# Patient Record
Sex: Female | Born: 1945
Health system: Southern US, Community
[De-identification: ages and names within clinical notes are randomized; demographics above are authoritative.]

## PROBLEM LIST (undated history)

## (undated) DIAGNOSIS — Z800 Family history of malignant neoplasm of digestive organs: Secondary | ICD-10-CM

## (undated) DIAGNOSIS — Z955 Presence of coronary angioplasty implant and graft: Secondary | ICD-10-CM

## (undated) DIAGNOSIS — I2510 Atherosclerotic heart disease of native coronary artery without angina pectoris: Secondary | ICD-10-CM

## (undated) DIAGNOSIS — I10 Essential (primary) hypertension: Secondary | ICD-10-CM

## (undated) DIAGNOSIS — M47812 Spondylosis without myelopathy or radiculopathy, cervical region: Secondary | ICD-10-CM

## (undated) HISTORY — DX: Family history of malignant neoplasm of digestive organs: Z80.0

## (undated) HISTORY — DX: Spondylosis without myelopathy or radiculopathy, cervical region: M47.812

---

## 1999-04-13 ENCOUNTER — Other Ambulatory Visit: Admission: RE | Admit: 1999-04-13 | Discharge: 1999-04-13 | Payer: Self-pay | Admitting: *Deleted

## 1999-11-11 ENCOUNTER — Ambulatory Visit (HOSPITAL_COMMUNITY): Admission: RE | Admit: 1999-11-11 | Discharge: 1999-11-11 | Payer: Self-pay | Admitting: Gastroenterology

## 2000-04-20 ENCOUNTER — Other Ambulatory Visit: Admission: RE | Admit: 2000-04-20 | Discharge: 2000-04-20 | Payer: Self-pay | Admitting: *Deleted

## 2000-04-21 ENCOUNTER — Encounter (INDEPENDENT_AMBULATORY_CARE_PROVIDER_SITE_OTHER): Payer: Self-pay

## 2000-04-21 ENCOUNTER — Other Ambulatory Visit: Admission: RE | Admit: 2000-04-21 | Discharge: 2000-04-21 | Payer: Self-pay | Admitting: *Deleted

## 2001-04-23 ENCOUNTER — Other Ambulatory Visit: Admission: RE | Admit: 2001-04-23 | Discharge: 2001-04-23 | Payer: Self-pay | Admitting: *Deleted

## 2002-04-25 ENCOUNTER — Other Ambulatory Visit: Admission: RE | Admit: 2002-04-25 | Discharge: 2002-04-25 | Payer: Self-pay | Admitting: Obstetrics and Gynecology

## 2003-05-01 ENCOUNTER — Other Ambulatory Visit: Admission: RE | Admit: 2003-05-01 | Discharge: 2003-05-01 | Payer: Self-pay | Admitting: Obstetrics and Gynecology

## 2003-08-26 ENCOUNTER — Ambulatory Visit (HOSPITAL_COMMUNITY): Admission: RE | Admit: 2003-08-26 | Discharge: 2003-08-26 | Payer: Self-pay | Admitting: Obstetrics and Gynecology

## 2003-08-26 ENCOUNTER — Encounter (INDEPENDENT_AMBULATORY_CARE_PROVIDER_SITE_OTHER): Payer: Self-pay | Admitting: *Deleted

## 2005-05-24 ENCOUNTER — Ambulatory Visit (HOSPITAL_COMMUNITY): Admission: RE | Admit: 2005-05-24 | Discharge: 2005-05-24 | Payer: Self-pay | Admitting: Gastroenterology

## 2006-05-19 ENCOUNTER — Encounter: Admission: RE | Admit: 2006-05-19 | Discharge: 2006-05-19 | Payer: Self-pay | Admitting: Internal Medicine

## 2010-07-23 ENCOUNTER — Encounter: Admission: RE | Admit: 2010-07-23 | Discharge: 2010-07-23 | Payer: Self-pay | Admitting: Neurosurgery

## 2010-09-16 ENCOUNTER — Ambulatory Visit (HOSPITAL_COMMUNITY): Admission: RE | Admit: 2010-09-16 | Discharge: 2010-09-18 | Payer: Self-pay | Admitting: Neurosurgery

## 2011-01-18 LAB — CBC
Hemoglobin: 12.9 g/dL (ref 12.0–15.0)
MCHC: 35 g/dL (ref 30.0–36.0)
RBC: 4.17 MIL/uL (ref 3.87–5.11)

## 2011-01-18 LAB — TYPE AND SCREEN: ABO/RH(D): O POS

## 2011-01-18 LAB — BASIC METABOLIC PANEL
Calcium: 9.1 mg/dL (ref 8.4–10.5)
GFR calc Af Amer: >60 mL/min (ref >60)
GFR calc non Af Amer: >60 mL/min (ref >60)
Glucose, Bld: 83 mg/dL (ref 70–99)
Sodium: 134 mEq/L — ABNORMAL LOW (ref 135–145)

## 2011-01-18 LAB — ABO/RH: ABO/RH(D): O POS

## 2011-01-18 LAB — SURGICAL PCR SCREEN: Staphylococcus aureus: NEGATIVE

## 2011-03-25 NOTE — H&P (Signed)
NAME:  [name], [name]                           ACCOUNT NO.:  [id number]   MEDICAL RECORD NO.:  [id number]                   PATIENT TYPE:  AMB   LOCATION:  SDC                                  FACILITY:  [facility]   PHYSICIAN:  Maxie Better, M.D.            DATE OF BIRTH:  February 28, 1946   DATE OF ADMISSION:  08/26/2003  DATE OF DISCHARGE:                                HISTORY & PHYSICAL   CHIEF COMPLAINT:  Postmenopausal bleeding, endometrial mass.   HISTORY OF PRESENT ILLNESS:  This is a 65 year old gravida 3, para 3,  postmenopausal married white female now being admitted for a D&C and  hysteroscopic removal of an endometrial mass found after evaluation for  postmenopausal bleeding.  The patient underwent ultrasound with subsequent  finding of a hyperechoic mass in the posterior wall on sonohysterogram.  The  patient now presents for surgical evaluation.   PAST MEDICAL HISTORY:  No known drug allergies.   Medical history is chronic hypertension.   Medicines are:  1. Lisinopril.  2. Prempro.  3. Multivitamins.   SURGICAL HISTORY:  Cauterization of the cervix in 1979.   OBSTETRICAL HISTORY:  Vaginal delivery x3.   FAMILY HISTORY:  Mother had thyroidectomy, diabetes, colon cancer.  No  breast or ovarian cancer.   REVIEW OF SYSTEMS:  Negative except as in history of present illness.   PHYSICAL EXAMINATION:  GENERAL:  A well-developed, well-nourished white  female in no acute distress.  VITAL SIGNS:  Blood pressure 122/72, temperature of 98.7, weight is 154-1/2.  SKIN:  No lesions.  HEENT:  Anicteric sclerae, pink sclerae, oropharynx negative.  CARDIAC:  Regular rate and rhythm without murmur.  CHEST:  Lungs are clear to auscultation.  BREASTS:  Soft, nontender, no palpable mass, upward nipples.  NECK:  Supple, no thyroid palpable.  ABDOMEN:  Soft, nontender, no organomegaly.  EXTREMITIES:  No edema.  PELVIC:  Vulva with no lesions.  The vagina had no discharge.  Cervix  was  parous.  Uterus anteverted, normal size, nontender.  Adnexa nontender, no  palpable masses.   IMPRESSION:  1. Postmenopausal bleeding.  2. Endometrial mass.   PLAN:  Admission for D&C, hysteroscopy, removal of endometrial mass.  Risks  of the procedure including but not limited to infection, bleeding, uterine  perforation and its management, fluid overload and its management, inability  to complete the procedure in one sitting, injury to surrounding organ  structures such as the bladder, bowel, or ureter, thermal injury, routine  labs ordered, all questions answered.                                                Maxie Better, M.D.    [place]/MEDQ  D:  08/26/2003  T:  08/26/2003  Job:  [id number]

## 2011-03-25 NOTE — Op Note (Signed)
NAME:  [name], [name] NO.:  [id number]   MEDICAL RECORD NO.:  [id number]          PATIENT TYPE:  AMB   LOCATION:  ENDO                         FACILITY:  [facility]   PHYSICIAN:  [name], M.D.   DATE OF BIRTH:  [birth year]   DATE OF PROCEDURE:  05/24/2005  DATE OF DISCHARGE:                                 OPERATIVE REPORT   PROCEDURE:  Surveillance colonoscopy.   INDICATIONS:  [name] is a 65 year old female born October 06, 1946. [name] mother was diagnosed with colon cancer. [name]  underwent her first screening colonoscopy approximately five years ago which  was normal. A surveillance colonoscopy with polypectomy to prevent colon  cancer is scheduled today.   ENDOSCOPIST:  [name], M.D.   PREMEDICATION:  Versed 10 mg, Demerol 50 mg.   DESCRIPTION OF PROCEDURE:  After obtaining informed consent, [name] was  placed in the left lateral decubitus position. I administered intravenous  Demerol and intravenous Versed to achieve conscious sedation for the  procedure. The patient's blood pressure, oxygen saturation and cardiac  rhythm were monitored throughout the procedure and documented in the medical  record.   Anal inspection and digital rectal exam were normal. The Olympus adjustable  pediatric colonoscope was introduced into the rectum and easily advanced to  the cecum with application of external abdominal pressure with the patient  remaining in the left lateral decubitus position. Colonic preparation for  the exam today was excellent   RECTUM:  Normal.  SIGMOID COLON AND DESCENDING COLON:  Normal.  SPLENIC FLEXURE:  Normal.  TRANSVERSE COLON:  Normal.  HEPATIC FLEXURE:  Normal.  ASCENDING COLON:  Normal. Diverticula are noted.  CECUM AND ILEOCECAL VALVE:  Normal.   ASSESSMENT:  Normal proctocolonoscopy to the cecum. No endoscopic evidence  for the presence of colorectal neoplasia. Diverticulosis of the ascending  colon  noted.   RECOMMENDATIONS:  Repeat colonoscopy in five years.       [facility]/MEDQ  D:  05/24/2005  T:  05/24/2005  Job:  [id number]   cc:   Candyce Churn, M.D.  [address] [place]  [place] [address]  Fax: [phone]

## 2011-03-25 NOTE — Op Note (Signed)
NAME:  [name], [name]                           ACCOUNT NO.:  [id number]   MEDICAL RECORD NO.:  [id number]                   PATIENT TYPE:  AMB   LOCATION:  SDC                                  FACILITY:  [facility]   PHYSICIAN:  Maxie Better, M.D.            DATE OF BIRTH:  06/16/1946   DATE OF PROCEDURE:  08/26/2003  DATE OF DISCHARGE:                                 OPERATIVE REPORT   PREOPERATIVE DIAGNOSES:  1. Postmenopausal bleeding.  2. Endometrial mass.   POSTOPERATIVE DIAGNOSES:  1. Postmenopausal bleeding.  2. Endometrial polyp.   PROCEDURE:  Diagnostic hysteroscopy, resection of endometrial polyp,  dilatation and curettage.   ANESTHESIA:  General anesthesia.   SURGEON:  Maxie Better, M.D.   INDICATIONS FOR PROCEDURE:  A 65 year old gravida 3, para 3, postmenopausal  married white female on hormone replacement therapy who was found on  sonohistogram to have an endometrial mass after evaluation for  postmenopausal bleeding and now presents for surgical management.  Risks and  benefits of the planned procedure had been explained to the patient.  Consent was signed.  The patient was transferred to the operating room.   DESCRIPTION OF PROCEDURE:  Under adequate general anesthesia, the patient  was placed in the dorsal lithotomy position.  She was sterilely prepped and  draped in the usual fashion.  In and out catheter revealed quantity  sufficient of urine.  Examination under anesthesia revealed an axial small  uterus, no adnexal masses could be appreciated.  A bivalve speculum was  placed in the vagina.  Cervix was noted to be parous.  A single-tooth  tenaculum was placed in the anterior lip of the cervix.  The cervix was  serially dilated up to #25 [facility] dilator.  A diagnostic hysteroscope was  introduced into the uterine cavity without incident.  A polypoid mass was  noted extending up the frontal area.  The diagnostic hysteroscope was  removed.  The cervix  was then further dilated up to #31 [facility] dilator and  the resectoscope was introduced.  The double loop was then used to resect  the endometrial polyp.  Both tubal ostia were seen.  The endocervical canal  was inspected.  No polyps noted.  The resectoscope was then removed.  The  cavity was then curetted for scant amount of tissue.  All instruments were  then removed from the vagina.  Specimen labeled endometrial polyp and  endometrial curetting was sent to pathology. Estimated blood loss was  minimal.  Fluid deficit was 80 mL.   COMPLICATIONS:  None.   The patient tolerated the procedure well and was transferred to the recovery  room in stable condition.  Maxie Better, M.D.   [place]/MEDQ  D:  08/26/2003  T:  08/26/2003  Job:  [id number]

## 2011-05-30 ENCOUNTER — Other Ambulatory Visit: Payer: Self-pay | Admitting: Gastroenterology

## 2011-06-06 ENCOUNTER — Encounter (HOSPITAL_COMMUNITY): Payer: Self-pay

## 2011-06-06 ENCOUNTER — Emergency Department (HOSPITAL_COMMUNITY): Payer: 59

## 2011-06-06 ENCOUNTER — Observation Stay (HOSPITAL_COMMUNITY)
Admission: EM | Admit: 2011-06-06 | Discharge: 2011-06-06 | Disposition: A | Discharge status: Home / Self Care | Payer: 59 | Attending: Emergency Medicine | Admitting: Emergency Medicine

## 2011-06-06 DIAGNOSIS — R61 Generalized hyperhidrosis: Secondary | ICD-10-CM | POA: Insufficient documentation

## 2011-06-06 DIAGNOSIS — R072 Precordial pain: Secondary | ICD-10-CM

## 2011-06-06 DIAGNOSIS — I10 Essential (primary) hypertension: Secondary | ICD-10-CM | POA: Insufficient documentation

## 2011-06-06 DIAGNOSIS — R079 Chest pain, unspecified: Principal | ICD-10-CM | POA: Insufficient documentation

## 2011-06-06 HISTORY — DX: Essential (primary) hypertension: I10

## 2011-06-06 LAB — DIFFERENTIAL
Basophils Absolute: 0 10*3/uL (ref 0.0–0.1)
Eosinophils Relative: 2 % (ref 0–5)
Lymphocytes Relative: 21 % (ref 12–46)
Lymphs Abs: 2.1 10*3/uL (ref 0.7–4.0)
Monocytes Absolute: 0.6 10*3/uL (ref 0.1–1.0)

## 2011-06-06 LAB — CK TOTAL AND CKMB (NOT AT ARMC)
CK, MB: 1.8 ng/mL (ref 0.3–4.0)
Relative Index: INVALID (ref 0.0–2.5)
Relative Index: INVALID (ref 0.0–2.5)

## 2011-06-06 LAB — TROPONIN I: Troponin I: <0.3 ng/mL (ref <0.30)

## 2011-06-06 LAB — CBC
HCT: 38.2 % (ref 36.0–46.0)
MCH: 30.5 pg (ref 26.0–34.0)
MCHC: 35.1 g/dL (ref 30.0–36.0)
MCV: 86.8 fL (ref 78.0–100.0)
RDW: 12.4 % (ref 11.5–15.5)

## 2011-06-06 LAB — URINALYSIS, ROUTINE W REFLEX MICROSCOPIC
Glucose, UA: NEGATIVE mg/dL
Ketones, ur: NEGATIVE mg/dL
Protein, ur: NEGATIVE mg/dL

## 2011-06-06 LAB — BASIC METABOLIC PANEL
BUN: 31 mg/dL — ABNORMAL HIGH (ref 6–23)
Calcium: 9.8 mg/dL (ref 8.4–10.5)
Creatinine, Ser: 0.8 mg/dL (ref 0.50–1.10)
GFR calc Af Amer: >60 mL/min (ref >60)
GFR calc non Af Amer: >60 mL/min (ref >60)

## 2011-06-06 MED ORDER — IOHEXOL 300 MG/ML  SOLN
80.0000 mL | Freq: Once | INTRAMUSCULAR | Status: AC | PRN
  Administered 2011-06-06: 110 mL via INTRAVENOUS

## 2011-06-08 HISTORY — PX: CARDIAC CATHETERIZATION: SHX172

## 2011-06-08 NOTE — Consult Note (Signed)
[name], [name] NO.:  [id number]  MEDICAL RECORD NO.:  [id number]  LOCATION:  1899                         FACILITY:  [facility]  PHYSICIAN:  Corky Crafts, MDDATE OF BIRTH:  Mar 30, 1946  DATE OF CONSULTATION:  06/06/2011 DATE OF DISCHARGE:  06/06/2011                                CONSULTATION   PRIMARY CARE PHYSICIAN:  Pearla Dubonnet, MD  REASON FOR CONSULTATION:  Chest discomfort.  HISTORY OF PRESENT ILLNESS:  The patient is a 65 year old woman with hypertension who felt chest pressure last night when going to bed.  She took an aspirin.  She just describes feeling different.  She does not usually exercise due to a discomfort that she has in her upper chest. She also has some foot pain.  The upper chest discomfort has been going on for over a year.  She had some type of neck abnormality requiring surgery.  She was hoping that the upper chest discomfort would resolve with this, but it has persisted and remained essentially unchanged.  She had no associated nausea, vomiting, or diaphoresis.  She did feel some chills.  There was no radiation of the pain.  She walked on the treadmill for 6 minutes doing standard Bruce protocol after ruling out for MI in the emergency room.  She had some ECG changes at peak exercise.  She also had 7/10 upper chest pain.  Echo images showed an inferoapical wall motion abnormality with exercise.  Her pain resolved with rest.  PAST MEDICAL HISTORY:  Hypertension.  PAST SURGICAL HISTORY:  Neck surgery in November 2011.  ALLERGIES:  No known drug allergies.  SOCIAL HISTORY:  She does not smoke.  FAMILY HISTORY:  Both parents had coronary artery disease in their 41s.  MEDICATIONS AT HOME: 1. Aspirin 81 mg daily. 2. Estradiol. 3. Lisinopril 25/12.5 mg daily.  REVIEW OF SYSTEMS:  Significant for the chest discomfort.  She also has foot pain.  No bleeding problems.  No kidney problems.  All other systems are  negative.  PHYSICAL EXAMINATION:  VITAL SIGNS:  Blood pressure 125/67 and heart rate 86. GENERAL:  She is awake and alert, no apparent distress. HEAD:  Normocephalic and atraumatic. EYES:  Extraocular movements are intact. NECK:  No JVD. CARDIOVASCULAR:  Regular rate and rhythm.  S1 and S2. LUNGS:  Clear to auscultation bilaterally. ABDOMEN:  Soft and nontender. EXTREMITIES:  No edema. NEURO:  No focal motor or sensory deficits.  LAB WORK:  Troponin is negative.  Creatinine 0.8, potassium 4.9.  EKG showed normal sinus rhythm with nonspecific ST-T wave changes.  Stress echo result as noted above.  CT angiogram showed no evidence of pulmonary embolus.  D-dimer was slightly elevated.  Chest x-ray showed no active cardiopulmonary disease.  ASSESSMENT/PLAN: 1. Cardiac:  We discussed the results of her stress test at length.     She is currently not having any discomfort.  Her findings are not     high risk, however, her symptoms are persistent.  We will give her     a prescription for nitroglycerin.  Continue aspirin and plan for     outpatient cath. 2. Continue blood pressure medicines.  We will check lipids done in     the office and continue with aggressive preventive therapy.  She     understands she will come back if she has any more chest discomfort     not relieved with nitroglycerin.  The risks and benefits of cardiac     catheterization were explained with the patient and she is     agreeable.  All questions answered.     Corky Crafts, MD     [facility]/MEDQ  D:  06/06/2011  T:  06/07/2011  Job:  [id number]  Electronically Signed by Lance Muss MD on 06/08/2011 01:26:52 PM

## 2011-06-10 ENCOUNTER — Inpatient Hospital Stay (HOSPITAL_BASED_OUTPATIENT_CLINIC_OR_DEPARTMENT_OTHER)
Admission: RE | Admit: 2011-06-10 | Discharge: 2011-06-10 | Disposition: A | Discharge status: Hospice | Payer: 59 | Source: Ambulatory Visit | Attending: Interventional Cardiology | Admitting: Interventional Cardiology

## 2011-06-10 ENCOUNTER — Ambulatory Visit (HOSPITAL_COMMUNITY)
Admission: AD | Admit: 2011-06-10 | Discharge: 2011-06-11 | DRG: 247 | Disposition: A | Discharge status: Home / Self Care | Payer: 59 | Source: Ambulatory Visit | Attending: Interventional Cardiology | Admitting: Interventional Cardiology

## 2011-06-10 DIAGNOSIS — I200 Unstable angina: Secondary | ICD-10-CM | POA: Insufficient documentation

## 2011-06-10 DIAGNOSIS — I2510 Atherosclerotic heart disease of native coronary artery without angina pectoris: Secondary | ICD-10-CM | POA: Insufficient documentation

## 2011-06-10 DIAGNOSIS — R9439 Abnormal result of other cardiovascular function study: Secondary | ICD-10-CM | POA: Insufficient documentation

## 2011-06-10 DIAGNOSIS — I10 Essential (primary) hypertension: Secondary | ICD-10-CM | POA: Insufficient documentation

## 2011-06-10 LAB — COMPREHENSIVE METABOLIC PANEL
Alkaline Phosphatase: 59 U/L (ref 39–117)
BUN: 11 mg/dL (ref 6–23)
Calcium: 9.1 mg/dL (ref 8.4–10.5)
Creatinine, Ser: 0.54 mg/dL (ref 0.50–1.10)
GFR calc Af Amer: >60 mL/min (ref >60)
Glucose, Bld: 100 mg/dL — ABNORMAL HIGH (ref 70–99)
Potassium: 3.8 mEq/L (ref 3.5–5.1)
Total Protein: 6.5 g/dL (ref 6.0–8.3)

## 2011-06-10 LAB — POCT ACTIVATED CLOTTING TIME: Activated Clotting Time: 364 seconds

## 2011-06-11 LAB — BASIC METABOLIC PANEL
CO2: 29 mEq/L (ref 19–32)
Calcium: 9.2 mg/dL (ref 8.4–10.5)
GFR calc non Af Amer: >60 mL/min (ref >60)
Potassium: 3.9 mEq/L (ref 3.5–5.1)
Sodium: 136 mEq/L (ref 135–145)

## 2011-06-11 LAB — PLATELET INHIBITION P2Y12: Platelet Function  P2Y12: 248 [PRU] (ref 194–418)

## 2011-06-11 LAB — CBC
MCH: 30.3 pg (ref 26.0–34.0)
MCHC: 34.8 g/dL (ref 30.0–36.0)
Platelets: 285 10*3/uL (ref 150–400)
RBC: 3.99 MIL/uL (ref 3.87–5.11)

## 2011-06-13 ENCOUNTER — Encounter: Payer: Self-pay | Admitting: *Deleted

## 2011-07-04 ENCOUNTER — Encounter (HOSPITAL_COMMUNITY)
Admission: RE | Admit: 2011-07-04 | Discharge: 2011-07-04 | Disposition: A | Discharge status: Home / Self Care | Payer: 59 | Source: Ambulatory Visit | Attending: Interventional Cardiology | Admitting: Interventional Cardiology

## 2011-07-04 DIAGNOSIS — I10 Essential (primary) hypertension: Secondary | ICD-10-CM | POA: Insufficient documentation

## 2011-07-04 DIAGNOSIS — I200 Unstable angina: Secondary | ICD-10-CM | POA: Insufficient documentation

## 2011-07-04 DIAGNOSIS — I2510 Atherosclerotic heart disease of native coronary artery without angina pectoris: Secondary | ICD-10-CM | POA: Insufficient documentation

## 2011-07-04 DIAGNOSIS — Z5189 Encounter for other specified aftercare: Secondary | ICD-10-CM | POA: Insufficient documentation

## 2011-07-04 DIAGNOSIS — Z9861 Coronary angioplasty status: Secondary | ICD-10-CM | POA: Insufficient documentation

## 2011-07-06 ENCOUNTER — Encounter (HOSPITAL_COMMUNITY): Payer: 59

## 2011-07-08 ENCOUNTER — Encounter (HOSPITAL_COMMUNITY): Payer: 59

## 2011-07-11 ENCOUNTER — Encounter (HOSPITAL_COMMUNITY): Payer: 59

## 2011-07-13 ENCOUNTER — Encounter (HOSPITAL_COMMUNITY): Payer: 59 | Attending: Interventional Cardiology

## 2011-07-13 ENCOUNTER — Encounter (HOSPITAL_COMMUNITY): Payer: 59

## 2011-07-13 DIAGNOSIS — Z5189 Encounter for other specified aftercare: Secondary | ICD-10-CM | POA: Insufficient documentation

## 2011-07-13 DIAGNOSIS — I200 Unstable angina: Secondary | ICD-10-CM | POA: Insufficient documentation

## 2011-07-13 DIAGNOSIS — I10 Essential (primary) hypertension: Secondary | ICD-10-CM | POA: Insufficient documentation

## 2011-07-13 DIAGNOSIS — Z9861 Coronary angioplasty status: Secondary | ICD-10-CM | POA: Insufficient documentation

## 2011-07-13 DIAGNOSIS — I2510 Atherosclerotic heart disease of native coronary artery without angina pectoris: Secondary | ICD-10-CM | POA: Insufficient documentation

## 2011-07-15 ENCOUNTER — Encounter (HOSPITAL_COMMUNITY): Payer: 59

## 2011-07-18 ENCOUNTER — Encounter (HOSPITAL_COMMUNITY): Payer: 59

## 2011-07-20 ENCOUNTER — Encounter (HOSPITAL_COMMUNITY): Payer: 59

## 2011-07-20 NOTE — Cardiovascular Report (Signed)
  NAMEBIRTHA, HATLER NO.:  [id number]  MEDICAL RECORD NO.:  [id number]  LOCATION:  6531                         FACILITY:  [facility]  PHYSICIAN:  Corky Crafts, MDDATE OF BIRTH:  04-18-1946  DATE OF PROCEDURE:  06/10/2011 DATE OF DISCHARGE:                           CARDIAC CATHETERIZATION   PRIMARY CARE PHYSICIAN:  Pearla Dubonnet, MD  PROCEDURE PERFORMED:  PCI of the LAD.  OPERATOR:  Corky Crafts, MD  INDICATIONS:  Unstable angina.  PROCEDURE NARRATIVE:  The diagnostic catheterization was done in the [facility].  The patient had negative troponin 5 days ago.  Physical exam was unchanged from the time she had her consultation done on June 06, 2011. Diagnostic catheterization revealed a 90% proximal LAD lesion with some mild diffuse disease around this focal area.  A 4-French sheath was changed out for a 6-French sheath using sterile technique.  A CLS 3.5 guiding catheter was used to engage the left main.  Angiomax was used for anticoagulation.  ACT was used to confirm that it was therapeutic. Prowater wire was placed across the lesion and the lesion was predilated with a 2.5 x 50 [place] Sprinter balloon.  The area was then stented with a 3.0 x 16 Promus, then inflated to 14 atmospheres.  This stent was postdilated with a 3.5 x 12 Guy Trek, inflated to 20 atmospheres.  There was no residual stenosis.  TIMI-3 flow was maintained throughout.  IC nitroglycerin was used to treat vasospasm.  IMPRESSION:  Successful drug-eluting stent to the proximal left anterior descending coronary artery.  There was a 3.0 x 16 Promus stent postdilated at 3.7 mm in diameter.  RECOMMENDATIONS:  Continue dual-antiplatelet therapy for at least 1 year.  She is currently in a research study and has been randomized to either Brilinta or a Plavix load, we will decide later in terms of which antiplatelet agent that she will stay on long term.     Corky Crafts,  MD     [facility]/MEDQ  D:  06/10/2011  T:  06/10/2011  Job:  [id number]  Electronically Signed by Lance Muss MD on 07/20/2011 12:33:42 PM

## 2011-07-20 NOTE — Cardiovascular Report (Signed)
NAME:  [name], [name] NO.:  [id number]  MEDICAL RECORD NO.:  [id number]  LOCATION:  [facility]                         FACILITY:  [facility]  PHYSICIAN:  Corky Crafts, MDDATE OF BIRTH:  01/25/1946  DATE OF PROCEDURE:  06/10/2011 DATE OF DISCHARGE:                           CARDIAC CATHETERIZATION   PROCEDURES PERFORMED:  Left heart catheterization, left ventriculogram, coronary angiogram, abdominal aortogram.  OPERATOR:  Corky Crafts, MD  INDICATIONS:  Acute coronary syndrome.  Troponin negative.  Abnormal stress test.  REFERRING PHYSICIAN:  Dr. Johnella Moloney  PROCEDURE NARRATIVE:  The risks and benefits of cardiac catheterization were explained to the patient.  Informed consent was obtained.  She was brought to the cath lab.  She was prepped and draped in the usual sterile fashion.  Her right groin was infiltrated with 1% lidocaine.  A 4-French sheath was placed into the right common femoral artery using modified Seldinger technique.  Left coronary artery angiography was performed using a JL-4 pigtail catheter.  The catheter was advanced to the vessel ostium under fluoroscopic guidance.  Digital angiography was performed in multiple projections using hand injection of contrast. Right coronary artery angiography was performed in a similar fashion using a 3-D RCA catheter.  Pigtail catheter was advanced to the ascending aorta across the aortic valve under fluoroscopic guidance. Power injection contrast was performed in the RAO projection to image the left ventricle.  The catheter was pulled back under continuous hemodynamic pressure monitoring.  Catheter was then withdrawn to the abdominal aorta and a power injection of contrast performed in the AP projection.  The sheath was sewn in.  FINDINGS:  The left main is widely patent.  Left circumflex is a large dominant vessel.  The OM-1 was large and angiographically normal.  The OM-2 is medium-sized and  angiographically normal.  The left PDA was medium-sized and appeared widely patent.  Left anterior descending was a large vessel.  There is a 90% proximal LAD lesion with mild disease proximal and distal to the focal stenosis, in the mid LAD there was a 40% lesion, first diagonal is small and widely patent.  The second diagonal is medium-sized and widely patent.  The right coronary artery was a nondominant vessel but widely patent.  Left ventriculogram showed normal ventricular function.  The estimated ejection fraction was 55%.  HEMODYNAMIC RESULTS:  Left ventricle pressure 113/2 and LVEDP of 13 mmHg.  Aortic pressure 114/60 with a mean aortic pressure 87 mmHg.  The abdominal aortogram shows no abdominal aortic aneurysm.  The bilateral single renal arteries both of which are widely patent.  HEMODYNAMIC RESULTS:  LV pressure 113/2 and LVEDP of 13 mmHg.  Aortic pressure 114/68 with a mean aortic pressure of 87 mmHg.  IMPRESSION: 1. Significant disease in proximal LAD which is the likely cause of     her symptoms. 2. Normal ventricular function with ejection fraction of 55-60%. 3. No abdominal aortic aneurysm.  Bilateral single renal arteries     which are patent. 4. Normal hemodynamics.  RECOMMENDATIONS:  We will plan on PCI of the LAD later today.  The sheath was sewn and she will be taken upstairs for PCI, will  likely use Angiomax and dual antiplatelet therapy.     Corky Crafts, MD     JSV/MEDQ  D:  06/10/2011  T:  06/10/2011  Job:  [id number]  Electronically Signed by Lance Muss MD on 07/20/2011 12:32:49 PM

## 2011-07-20 NOTE — Discharge Summary (Signed)
  [name], [name] NO.:  [id number]  MEDICAL RECORD NO.:  [id number]  LOCATION:  6531                         FACILITY:  [facility]  PHYSICIAN:  Corky Crafts, MDDATE OF BIRTH:  1946/09/21  DATE OF ADMISSION:  06/10/2011 DATE OF DISCHARGE:                              DISCHARGE SUMMARY   FINAL DIAGNOSES: 1. Coronary artery disease. 2. Unstable angina. 3. Hypertension. 4. Possible Plavix resistance.  PROCEDURES PERFORMED: 1. Cardiac catheterization revealing 90% proximal LAD lesion,     subsequent drug-eluting stent placement into the proximal LAD with     a 3.0 x 16 PROMUS stent postdilated to 3.7-mm in diameter. 2. Platelet function testing after Plavix loading dose showing     platelet function P2Y12 test result at 248 PRU.  HOSPITAL COURSE:  The patient was admitted after having an outpatient catheterization.  This revealed a 90% proximal LAD lesion with dye injection.  She had discomfort in her chest.  We, therefore, admitted her to the hospital and performed PCI on the proximal LAD.  She tolerated the procedure well.  She had no significant problems.  She was part of a research protocol as noted above, although she had received Plavix, her PRU value was elevated.  We, therefore, switched her to Brilinta.  We also had simvastatin.  The morning after the procedure, there was no hematoma.  Her physical exam was unchanged from prior to the cardiac catheterization.  She had no discomfort in her chest.  DISCHARGE LABORATORY DATA:  Creatinine 0.74, hemoglobin 12.1.  DISCHARGE MEDICATIONS: 1. Brilinta 90 mg p.o. b.i.d. 2. Simvastatin 20 mg daily. 3. Aspirin 81 mg p.o. daily. 4. Calcium/magnesium 1 tablet every other day. 5. Lisinopril/hydrochlorothiazide 20/12.5 mg daily. 6. Multivitamin and vitamin D3 1000 units 2 capsules daily.  DIET:  Low-sodium heart-healthy diet.  ACTIVITY:  Increase activity slowly.  No lifting more than 10 pounds for 1  week.  No driving for 2 days.  Followup appointments with Dr. Eldridge Dace in 2-3 weeks.     Corky Crafts, MD     [facility]/MEDQ  D:  06/11/2011  T:  06/11/2011  Job:  [id number]  Electronically Signed by Lance Muss MD on 07/20/2011 12:33:56 PM

## 2011-07-22 ENCOUNTER — Encounter (HOSPITAL_COMMUNITY): Payer: 59

## 2011-07-25 ENCOUNTER — Encounter (HOSPITAL_COMMUNITY): Payer: 59

## 2011-07-27 ENCOUNTER — Encounter (HOSPITAL_COMMUNITY): Payer: 59

## 2011-07-29 ENCOUNTER — Encounter (HOSPITAL_COMMUNITY): Payer: 59

## 2011-08-01 ENCOUNTER — Encounter (HOSPITAL_COMMUNITY): Payer: 59

## 2011-08-03 ENCOUNTER — Encounter (HOSPITAL_COMMUNITY): Payer: 59

## 2011-08-05 ENCOUNTER — Encounter (HOSPITAL_COMMUNITY): Payer: 59

## 2011-08-08 ENCOUNTER — Encounter (HOSPITAL_COMMUNITY): Payer: 59

## 2011-08-08 ENCOUNTER — Encounter (HOSPITAL_COMMUNITY): Payer: 59 | Attending: Interventional Cardiology

## 2011-08-08 DIAGNOSIS — I200 Unstable angina: Secondary | ICD-10-CM | POA: Insufficient documentation

## 2011-08-08 DIAGNOSIS — I2510 Atherosclerotic heart disease of native coronary artery without angina pectoris: Secondary | ICD-10-CM | POA: Insufficient documentation

## 2011-08-08 DIAGNOSIS — Z9861 Coronary angioplasty status: Secondary | ICD-10-CM | POA: Insufficient documentation

## 2011-08-08 DIAGNOSIS — I10 Essential (primary) hypertension: Secondary | ICD-10-CM | POA: Insufficient documentation

## 2011-08-08 DIAGNOSIS — Z5189 Encounter for other specified aftercare: Secondary | ICD-10-CM | POA: Insufficient documentation

## 2011-08-10 ENCOUNTER — Encounter (HOSPITAL_COMMUNITY): Payer: 59

## 2011-08-12 ENCOUNTER — Encounter (HOSPITAL_COMMUNITY): Payer: 59

## 2011-08-15 ENCOUNTER — Encounter (HOSPITAL_COMMUNITY): Payer: 59

## 2011-08-17 ENCOUNTER — Encounter (HOSPITAL_COMMUNITY): Payer: 59

## 2011-08-19 ENCOUNTER — Encounter (HOSPITAL_COMMUNITY): Payer: 59

## 2011-08-22 ENCOUNTER — Encounter (HOSPITAL_COMMUNITY): Payer: 59

## 2011-08-24 ENCOUNTER — Encounter (HOSPITAL_COMMUNITY): Payer: 59

## 2011-08-26 ENCOUNTER — Encounter (HOSPITAL_COMMUNITY): Payer: 59

## 2011-08-29 ENCOUNTER — Encounter (HOSPITAL_COMMUNITY): Payer: 59

## 2011-08-31 ENCOUNTER — Encounter (HOSPITAL_COMMUNITY): Payer: 59

## 2011-09-02 ENCOUNTER — Encounter (HOSPITAL_COMMUNITY): Payer: 59

## 2011-09-05 ENCOUNTER — Encounter (HOSPITAL_COMMUNITY): Payer: 59

## 2011-09-07 ENCOUNTER — Encounter (HOSPITAL_COMMUNITY): Payer: 59

## 2011-09-09 ENCOUNTER — Encounter (HOSPITAL_COMMUNITY): Payer: 59

## 2011-09-12 ENCOUNTER — Encounter (HOSPITAL_COMMUNITY): Payer: 59

## 2011-09-12 DIAGNOSIS — I200 Unstable angina: Secondary | ICD-10-CM | POA: Insufficient documentation

## 2011-09-12 DIAGNOSIS — I10 Essential (primary) hypertension: Secondary | ICD-10-CM | POA: Insufficient documentation

## 2011-09-12 DIAGNOSIS — Z9861 Coronary angioplasty status: Secondary | ICD-10-CM | POA: Insufficient documentation

## 2011-09-12 DIAGNOSIS — Z5189 Encounter for other specified aftercare: Secondary | ICD-10-CM | POA: Insufficient documentation

## 2011-09-12 DIAGNOSIS — I2510 Atherosclerotic heart disease of native coronary artery without angina pectoris: Secondary | ICD-10-CM | POA: Insufficient documentation

## 2011-09-14 ENCOUNTER — Encounter (HOSPITAL_COMMUNITY): Payer: 59

## 2011-09-16 ENCOUNTER — Encounter (HOSPITAL_COMMUNITY): Payer: 59

## 2011-09-19 ENCOUNTER — Encounter (HOSPITAL_COMMUNITY): Payer: 59

## 2011-09-21 ENCOUNTER — Encounter (HOSPITAL_COMMUNITY): Payer: 59

## 2011-09-23 ENCOUNTER — Encounter (HOSPITAL_COMMUNITY)
Admission: RE | Admit: 2011-09-23 | Discharge: 2011-09-23 | Disposition: A | Discharge status: Home / Self Care | Payer: 59 | Source: Ambulatory Visit | Attending: Interventional Cardiology | Admitting: Interventional Cardiology

## 2011-09-26 ENCOUNTER — Encounter (HOSPITAL_COMMUNITY): Payer: 59

## 2011-09-26 ENCOUNTER — Encounter (HOSPITAL_COMMUNITY)
Admission: RE | Admit: 2011-09-26 | Discharge: 2011-09-26 | Disposition: A | Discharge status: Home / Self Care | Payer: 59 | Source: Ambulatory Visit | Attending: Interventional Cardiology | Admitting: Interventional Cardiology

## 2011-09-28 ENCOUNTER — Encounter (HOSPITAL_COMMUNITY)
Admission: RE | Admit: 2011-09-28 | Discharge: 2011-09-28 | Disposition: A | Discharge status: Home / Self Care | Payer: 59 | Source: Ambulatory Visit | Attending: Interventional Cardiology | Admitting: Interventional Cardiology

## 2011-09-28 ENCOUNTER — Encounter (HOSPITAL_COMMUNITY): Payer: 59

## 2011-09-30 ENCOUNTER — Encounter (HOSPITAL_COMMUNITY): Payer: 59

## 2011-10-03 ENCOUNTER — Encounter (HOSPITAL_COMMUNITY): Payer: 59

## 2011-10-03 ENCOUNTER — Encounter (HOSPITAL_COMMUNITY)
Admission: RE | Admit: 2011-10-03 | Discharge: 2011-10-03 | Disposition: A | Discharge status: Home / Self Care | Payer: 59 | Source: Ambulatory Visit | Attending: Interventional Cardiology | Admitting: Interventional Cardiology

## 2011-10-04 NOTE — Progress Notes (Signed)
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  [name] saw Dr Sunday Corn. Dr Eldridge Dace decreased her lisinopril Hctz to a 1/2 tablet once a day.  [name] is interested in our outpatient cardiac rehab program. Patient given a brochure about the maintenance program.

## 2011-10-05 ENCOUNTER — Encounter (HOSPITAL_COMMUNITY)
Admission: RE | Admit: 2011-10-05 | Discharge: 2011-10-05 | Disposition: A | Discharge status: Home / Self Care | Payer: 59 | Source: Ambulatory Visit | Attending: Interventional Cardiology | Admitting: Interventional Cardiology

## 2011-10-05 ENCOUNTER — Encounter (HOSPITAL_COMMUNITY): Payer: 59

## 2011-10-07 ENCOUNTER — Encounter (HOSPITAL_COMMUNITY)
Admission: RE | Admit: 2011-10-07 | Discharge: 2011-10-07 | Disposition: A | Discharge status: Home / Self Care | Payer: Self-pay | Source: Ambulatory Visit | Attending: Interventional Cardiology | Admitting: Interventional Cardiology

## 2011-10-07 ENCOUNTER — Encounter (HOSPITAL_COMMUNITY): Payer: 59

## 2011-10-07 DIAGNOSIS — I10 Essential (primary) hypertension: Secondary | ICD-10-CM | POA: Insufficient documentation

## 2011-10-07 DIAGNOSIS — I2510 Atherosclerotic heart disease of native coronary artery without angina pectoris: Secondary | ICD-10-CM | POA: Insufficient documentation

## 2011-10-07 DIAGNOSIS — Z5189 Encounter for other specified aftercare: Secondary | ICD-10-CM | POA: Insufficient documentation

## 2011-10-07 DIAGNOSIS — Z9861 Coronary angioplasty status: Secondary | ICD-10-CM | POA: Insufficient documentation

## 2011-10-07 DIAGNOSIS — I200 Unstable angina: Secondary | ICD-10-CM | POA: Insufficient documentation

## 2011-10-10 ENCOUNTER — Encounter (HOSPITAL_COMMUNITY)
Admission: RE | Admit: 2011-10-10 | Discharge: 2011-10-10 | Disposition: A | Discharge status: Home / Self Care | Payer: Self-pay | Source: Ambulatory Visit | Attending: Interventional Cardiology | Admitting: Interventional Cardiology

## 2011-10-10 DIAGNOSIS — I200 Unstable angina: Secondary | ICD-10-CM | POA: Insufficient documentation

## 2011-10-10 DIAGNOSIS — I2510 Atherosclerotic heart disease of native coronary artery without angina pectoris: Secondary | ICD-10-CM | POA: Insufficient documentation

## 2011-10-10 DIAGNOSIS — Z9861 Coronary angioplasty status: Secondary | ICD-10-CM | POA: Insufficient documentation

## 2011-10-10 DIAGNOSIS — I10 Essential (primary) hypertension: Secondary | ICD-10-CM | POA: Insufficient documentation

## 2011-10-10 DIAGNOSIS — Z5189 Encounter for other specified aftercare: Secondary | ICD-10-CM | POA: Insufficient documentation

## 2011-10-12 ENCOUNTER — Encounter (HOSPITAL_COMMUNITY)
Admission: RE | Admit: 2011-10-12 | Discharge: 2011-10-12 | Disposition: A | Discharge status: Home / Self Care | Payer: Self-pay | Source: Ambulatory Visit | Attending: Interventional Cardiology | Admitting: Interventional Cardiology

## 2011-10-14 ENCOUNTER — Encounter (HOSPITAL_COMMUNITY)
Admission: RE | Admit: 2011-10-14 | Discharge: 2011-10-14 | Disposition: A | Discharge status: Home / Self Care | Payer: Self-pay | Source: Ambulatory Visit | Attending: Interventional Cardiology | Admitting: Interventional Cardiology

## 2011-10-17 ENCOUNTER — Encounter (HOSPITAL_COMMUNITY)
Admission: RE | Admit: 2011-10-17 | Discharge: 2011-10-17 | Disposition: A | Discharge status: Home / Self Care | Payer: Self-pay | Source: Ambulatory Visit | Attending: Interventional Cardiology | Admitting: Interventional Cardiology

## 2011-10-19 ENCOUNTER — Encounter (HOSPITAL_COMMUNITY)
Admission: RE | Admit: 2011-10-19 | Discharge: 2011-10-19 | Disposition: A | Discharge status: Home / Self Care | Payer: Self-pay | Source: Ambulatory Visit | Attending: Interventional Cardiology | Admitting: Interventional Cardiology

## 2011-10-21 ENCOUNTER — Encounter (HOSPITAL_COMMUNITY)
Admission: RE | Admit: 2011-10-21 | Discharge: 2011-10-21 | Disposition: A | Discharge status: Home / Self Care | Payer: Self-pay | Source: Ambulatory Visit | Attending: Interventional Cardiology | Admitting: Interventional Cardiology

## 2011-10-24 ENCOUNTER — Encounter (HOSPITAL_COMMUNITY)
Admission: RE | Admit: 2011-10-24 | Discharge: 2011-10-24 | Disposition: A | Discharge status: Home / Self Care | Payer: Self-pay | Source: Ambulatory Visit | Attending: Interventional Cardiology | Admitting: Interventional Cardiology

## 2011-10-26 ENCOUNTER — Encounter (HOSPITAL_COMMUNITY)
Admission: RE | Admit: 2011-10-26 | Discharge: 2011-10-26 | Disposition: A | Discharge status: Home / Self Care | Payer: Self-pay | Source: Ambulatory Visit | Attending: Interventional Cardiology | Admitting: Interventional Cardiology

## 2011-10-28 ENCOUNTER — Encounter (HOSPITAL_COMMUNITY)
Admission: RE | Admit: 2011-10-28 | Discharge: 2011-10-28 | Disposition: A | Discharge status: Home / Self Care | Payer: Self-pay | Source: Ambulatory Visit | Attending: Interventional Cardiology | Admitting: Interventional Cardiology

## 2011-10-31 ENCOUNTER — Encounter (HOSPITAL_COMMUNITY): Payer: Self-pay

## 2011-11-02 ENCOUNTER — Encounter (HOSPITAL_COMMUNITY): Payer: Self-pay

## 2011-11-04 ENCOUNTER — Encounter (HOSPITAL_COMMUNITY): Payer: Self-pay

## 2011-11-07 ENCOUNTER — Encounter (HOSPITAL_COMMUNITY): Payer: Self-pay

## 2011-11-09 ENCOUNTER — Encounter (HOSPITAL_COMMUNITY): Payer: Self-pay

## 2011-11-11 ENCOUNTER — Encounter (HOSPITAL_COMMUNITY): Payer: Self-pay

## 2011-11-11 DIAGNOSIS — I2510 Atherosclerotic heart disease of native coronary artery without angina pectoris: Secondary | ICD-10-CM | POA: Insufficient documentation

## 2011-11-11 DIAGNOSIS — I200 Unstable angina: Secondary | ICD-10-CM | POA: Insufficient documentation

## 2011-11-11 DIAGNOSIS — Z5189 Encounter for other specified aftercare: Secondary | ICD-10-CM | POA: Insufficient documentation

## 2011-11-11 DIAGNOSIS — I10 Essential (primary) hypertension: Secondary | ICD-10-CM | POA: Insufficient documentation

## 2011-11-11 DIAGNOSIS — Z9861 Coronary angioplasty status: Secondary | ICD-10-CM | POA: Insufficient documentation

## 2011-11-14 ENCOUNTER — Encounter (HOSPITAL_COMMUNITY)
Admission: RE | Admit: 2011-11-14 | Discharge: 2011-11-14 | Disposition: A | Discharge status: Home / Self Care | Payer: Self-pay | Source: Ambulatory Visit | Attending: Interventional Cardiology | Admitting: Interventional Cardiology

## 2011-11-16 ENCOUNTER — Encounter (HOSPITAL_COMMUNITY)
Admission: RE | Admit: 2011-11-16 | Discharge: 2011-11-16 | Disposition: A | Discharge status: Home / Self Care | Payer: Self-pay | Source: Ambulatory Visit | Attending: Interventional Cardiology | Admitting: Interventional Cardiology

## 2011-11-18 ENCOUNTER — Encounter (HOSPITAL_COMMUNITY)
Admission: RE | Admit: 2011-11-18 | Discharge: 2011-11-18 | Disposition: A | Discharge status: Home / Self Care | Payer: Self-pay | Source: Ambulatory Visit | Attending: Interventional Cardiology | Admitting: Interventional Cardiology

## 2011-11-21 ENCOUNTER — Encounter (HOSPITAL_COMMUNITY)
Admission: RE | Admit: 2011-11-21 | Discharge: 2011-11-21 | Disposition: A | Discharge status: Home / Self Care | Payer: Self-pay | Source: Ambulatory Visit | Attending: Interventional Cardiology | Admitting: Interventional Cardiology

## 2011-11-23 ENCOUNTER — Encounter (HOSPITAL_COMMUNITY)
Admission: RE | Admit: 2011-11-23 | Discharge: 2011-11-23 | Disposition: A | Discharge status: Home / Self Care | Payer: Self-pay | Source: Ambulatory Visit | Attending: Interventional Cardiology | Admitting: Interventional Cardiology

## 2011-11-25 ENCOUNTER — Encounter (HOSPITAL_COMMUNITY)
Admission: RE | Admit: 2011-11-25 | Discharge: 2011-11-25 | Disposition: A | Discharge status: Home / Self Care | Payer: Self-pay | Source: Ambulatory Visit | Attending: Interventional Cardiology | Admitting: Interventional Cardiology

## 2011-11-28 ENCOUNTER — Encounter (HOSPITAL_COMMUNITY)
Admission: RE | Admit: 2011-11-28 | Discharge: 2011-11-28 | Disposition: A | Discharge status: Home / Self Care | Payer: Self-pay | Source: Ambulatory Visit | Attending: Interventional Cardiology | Admitting: Interventional Cardiology

## 2011-11-30 ENCOUNTER — Encounter (HOSPITAL_COMMUNITY)
Admission: RE | Admit: 2011-11-30 | Discharge: 2011-11-30 | Disposition: A | Discharge status: Home / Self Care | Payer: Self-pay | Source: Ambulatory Visit | Attending: Interventional Cardiology | Admitting: Interventional Cardiology

## 2011-12-02 ENCOUNTER — Encounter (HOSPITAL_COMMUNITY): Payer: Self-pay

## 2011-12-05 ENCOUNTER — Encounter (HOSPITAL_COMMUNITY)
Admission: RE | Admit: 2011-12-05 | Discharge: 2011-12-05 | Disposition: A | Discharge status: Home / Self Care | Payer: Self-pay | Source: Ambulatory Visit | Attending: Interventional Cardiology | Admitting: Interventional Cardiology

## 2011-12-07 ENCOUNTER — Encounter (HOSPITAL_COMMUNITY)
Admission: RE | Admit: 2011-12-07 | Discharge: 2011-12-07 | Disposition: A | Discharge status: Home / Self Care | Payer: Self-pay | Source: Ambulatory Visit | Attending: Interventional Cardiology | Admitting: Interventional Cardiology

## 2011-12-09 ENCOUNTER — Encounter (HOSPITAL_COMMUNITY)
Admission: RE | Admit: 2011-12-09 | Discharge: 2011-12-09 | Disposition: A | Discharge status: Home / Self Care | Payer: Self-pay | Source: Ambulatory Visit | Attending: Interventional Cardiology | Admitting: Interventional Cardiology

## 2011-12-09 DIAGNOSIS — I200 Unstable angina: Secondary | ICD-10-CM | POA: Insufficient documentation

## 2011-12-09 DIAGNOSIS — Z5189 Encounter for other specified aftercare: Secondary | ICD-10-CM | POA: Insufficient documentation

## 2011-12-09 DIAGNOSIS — Z9861 Coronary angioplasty status: Secondary | ICD-10-CM | POA: Insufficient documentation

## 2011-12-09 DIAGNOSIS — I10 Essential (primary) hypertension: Secondary | ICD-10-CM | POA: Insufficient documentation

## 2011-12-09 DIAGNOSIS — I2510 Atherosclerotic heart disease of native coronary artery without angina pectoris: Secondary | ICD-10-CM | POA: Insufficient documentation

## 2011-12-12 ENCOUNTER — Encounter (HOSPITAL_COMMUNITY)
Admission: RE | Admit: 2011-12-12 | Discharge: 2011-12-12 | Disposition: A | Discharge status: Home / Self Care | Payer: Self-pay | Source: Ambulatory Visit | Attending: Interventional Cardiology | Admitting: Interventional Cardiology

## 2011-12-14 ENCOUNTER — Encounter (HOSPITAL_COMMUNITY)
Admission: RE | Admit: 2011-12-14 | Discharge: 2011-12-14 | Disposition: A | Discharge status: Home / Self Care | Payer: Self-pay | Source: Ambulatory Visit | Attending: Interventional Cardiology | Admitting: Interventional Cardiology

## 2011-12-16 ENCOUNTER — Encounter (HOSPITAL_COMMUNITY)
Admission: RE | Admit: 2011-12-16 | Discharge: 2011-12-16 | Disposition: A | Discharge status: Home / Self Care | Payer: Self-pay | Source: Ambulatory Visit | Attending: Interventional Cardiology | Admitting: Interventional Cardiology

## 2011-12-19 ENCOUNTER — Encounter (HOSPITAL_COMMUNITY)
Admission: RE | Admit: 2011-12-19 | Discharge: 2011-12-19 | Disposition: A | Discharge status: Home / Self Care | Payer: Self-pay | Source: Ambulatory Visit | Attending: Interventional Cardiology | Admitting: Interventional Cardiology

## 2011-12-21 ENCOUNTER — Encounter (HOSPITAL_COMMUNITY)
Admission: RE | Admit: 2011-12-21 | Discharge: 2011-12-21 | Disposition: A | Discharge status: Home / Self Care | Payer: Self-pay | Source: Ambulatory Visit | Attending: Interventional Cardiology | Admitting: Interventional Cardiology

## 2011-12-23 ENCOUNTER — Encounter (HOSPITAL_COMMUNITY)
Admission: RE | Admit: 2011-12-23 | Discharge: 2011-12-23 | Disposition: A | Discharge status: Home / Self Care | Payer: Self-pay | Source: Ambulatory Visit | Attending: Interventional Cardiology | Admitting: Interventional Cardiology

## 2011-12-26 ENCOUNTER — Encounter (HOSPITAL_COMMUNITY)
Admission: RE | Admit: 2011-12-26 | Discharge: 2011-12-26 | Disposition: A | Discharge status: Home / Self Care | Payer: Self-pay | Source: Ambulatory Visit | Attending: Interventional Cardiology | Admitting: Interventional Cardiology

## 2011-12-28 ENCOUNTER — Encounter (HOSPITAL_COMMUNITY)
Admission: RE | Admit: 2011-12-28 | Discharge: 2011-12-28 | Disposition: A | Discharge status: Home / Self Care | Payer: Self-pay | Source: Ambulatory Visit | Attending: Interventional Cardiology | Admitting: Interventional Cardiology

## 2011-12-30 ENCOUNTER — Encounter (HOSPITAL_COMMUNITY)
Admission: RE | Admit: 2011-12-30 | Discharge: 2011-12-30 | Disposition: A | Discharge status: Home / Self Care | Payer: Self-pay | Source: Ambulatory Visit | Attending: Interventional Cardiology | Admitting: Interventional Cardiology

## 2012-01-02 ENCOUNTER — Encounter (HOSPITAL_COMMUNITY)
Admission: RE | Admit: 2012-01-02 | Discharge: 2012-01-02 | Disposition: A | Discharge status: Home / Self Care | Payer: Self-pay | Source: Ambulatory Visit | Attending: Interventional Cardiology | Admitting: Interventional Cardiology

## 2012-01-04 ENCOUNTER — Encounter (HOSPITAL_COMMUNITY)
Admission: RE | Admit: 2012-01-04 | Discharge: 2012-01-04 | Disposition: A | Discharge status: Home / Self Care | Payer: Self-pay | Source: Ambulatory Visit | Attending: Interventional Cardiology | Admitting: Interventional Cardiology

## 2012-01-06 ENCOUNTER — Encounter (HOSPITAL_COMMUNITY)
Admission: RE | Admit: 2012-01-06 | Discharge: 2012-01-06 | Disposition: A | Discharge status: Home / Self Care | Payer: Self-pay | Source: Ambulatory Visit | Attending: Interventional Cardiology | Admitting: Interventional Cardiology

## 2012-01-06 DIAGNOSIS — Z5189 Encounter for other specified aftercare: Secondary | ICD-10-CM | POA: Insufficient documentation

## 2012-01-06 DIAGNOSIS — I2510 Atherosclerotic heart disease of native coronary artery without angina pectoris: Secondary | ICD-10-CM | POA: Insufficient documentation

## 2012-01-06 DIAGNOSIS — Z9861 Coronary angioplasty status: Secondary | ICD-10-CM | POA: Insufficient documentation

## 2012-01-06 DIAGNOSIS — I200 Unstable angina: Secondary | ICD-10-CM | POA: Insufficient documentation

## 2012-01-06 DIAGNOSIS — I10 Essential (primary) hypertension: Secondary | ICD-10-CM | POA: Insufficient documentation

## 2012-01-09 ENCOUNTER — Encounter (HOSPITAL_COMMUNITY)
Admission: RE | Admit: 2012-01-09 | Discharge: 2012-01-09 | Disposition: A | Discharge status: Home / Self Care | Payer: Self-pay | Source: Ambulatory Visit | Attending: Interventional Cardiology | Admitting: Interventional Cardiology

## 2012-01-11 ENCOUNTER — Encounter (HOSPITAL_COMMUNITY)
Admission: RE | Admit: 2012-01-11 | Discharge: 2012-01-11 | Disposition: A | Discharge status: Home / Self Care | Payer: Self-pay | Source: Ambulatory Visit | Attending: Interventional Cardiology | Admitting: Interventional Cardiology

## 2012-01-13 ENCOUNTER — Encounter (HOSPITAL_COMMUNITY): Payer: Self-pay

## 2012-01-16 ENCOUNTER — Encounter (HOSPITAL_COMMUNITY)
Admission: RE | Admit: 2012-01-16 | Discharge: 2012-01-16 | Disposition: A | Discharge status: Home / Self Care | Payer: Self-pay | Source: Ambulatory Visit | Attending: Interventional Cardiology | Admitting: Interventional Cardiology

## 2012-01-18 ENCOUNTER — Encounter (HOSPITAL_COMMUNITY)
Admission: RE | Admit: 2012-01-18 | Discharge: 2012-01-18 | Disposition: A | Discharge status: Home / Self Care | Payer: Self-pay | Source: Ambulatory Visit | Attending: Interventional Cardiology | Admitting: Interventional Cardiology

## 2012-01-20 ENCOUNTER — Encounter (HOSPITAL_COMMUNITY)
Admission: RE | Admit: 2012-01-20 | Discharge: 2012-01-20 | Disposition: A | Discharge status: Home / Self Care | Payer: Self-pay | Source: Ambulatory Visit | Attending: Interventional Cardiology | Admitting: Interventional Cardiology

## 2012-01-23 ENCOUNTER — Encounter (HOSPITAL_COMMUNITY)
Admission: RE | Admit: 2012-01-23 | Discharge: 2012-01-23 | Disposition: A | Discharge status: Home / Self Care | Payer: Self-pay | Source: Ambulatory Visit | Attending: Interventional Cardiology | Admitting: Interventional Cardiology

## 2012-01-25 ENCOUNTER — Encounter (HOSPITAL_COMMUNITY)
Admission: RE | Admit: 2012-01-25 | Discharge: 2012-01-25 | Disposition: A | Discharge status: Home / Self Care | Payer: Self-pay | Source: Ambulatory Visit | Attending: Interventional Cardiology | Admitting: Interventional Cardiology

## 2012-01-27 ENCOUNTER — Encounter (HOSPITAL_COMMUNITY)
Admission: RE | Admit: 2012-01-27 | Discharge: 2012-01-27 | Disposition: A | Discharge status: Home / Self Care | Payer: Self-pay | Source: Ambulatory Visit | Attending: Interventional Cardiology | Admitting: Interventional Cardiology

## 2012-01-30 ENCOUNTER — Encounter (HOSPITAL_COMMUNITY)
Admission: RE | Admit: 2012-01-30 | Discharge: 2012-01-30 | Disposition: A | Discharge status: Home / Self Care | Payer: Self-pay | Source: Ambulatory Visit | Attending: Interventional Cardiology | Admitting: Interventional Cardiology

## 2012-02-01 ENCOUNTER — Encounter (HOSPITAL_COMMUNITY)
Admission: RE | Admit: 2012-02-01 | Discharge: 2012-02-01 | Disposition: A | Discharge status: Home / Self Care | Payer: Self-pay | Source: Ambulatory Visit | Attending: Interventional Cardiology | Admitting: Interventional Cardiology

## 2012-02-03 ENCOUNTER — Encounter (HOSPITAL_COMMUNITY)
Admission: RE | Admit: 2012-02-03 | Discharge: 2012-02-03 | Disposition: A | Discharge status: Home / Self Care | Payer: Self-pay | Source: Ambulatory Visit | Attending: Interventional Cardiology | Admitting: Interventional Cardiology

## 2012-02-06 ENCOUNTER — Encounter (HOSPITAL_COMMUNITY): Payer: Self-pay

## 2012-02-06 DIAGNOSIS — I200 Unstable angina: Secondary | ICD-10-CM | POA: Insufficient documentation

## 2012-02-06 DIAGNOSIS — I10 Essential (primary) hypertension: Secondary | ICD-10-CM | POA: Insufficient documentation

## 2012-02-06 DIAGNOSIS — Z5189 Encounter for other specified aftercare: Secondary | ICD-10-CM | POA: Insufficient documentation

## 2012-02-06 DIAGNOSIS — Z9861 Coronary angioplasty status: Secondary | ICD-10-CM | POA: Insufficient documentation

## 2012-02-06 DIAGNOSIS — I2510 Atherosclerotic heart disease of native coronary artery without angina pectoris: Secondary | ICD-10-CM | POA: Insufficient documentation

## 2012-02-08 ENCOUNTER — Encounter (HOSPITAL_COMMUNITY): Payer: Self-pay

## 2012-02-10 ENCOUNTER — Encounter (HOSPITAL_COMMUNITY): Payer: Self-pay

## 2012-02-13 ENCOUNTER — Encounter (HOSPITAL_COMMUNITY)
Admission: RE | Admit: 2012-02-13 | Discharge: 2012-02-13 | Disposition: A | Discharge status: Home / Self Care | Payer: Self-pay | Source: Ambulatory Visit | Attending: Interventional Cardiology | Admitting: Interventional Cardiology

## 2012-02-15 ENCOUNTER — Encounter (HOSPITAL_COMMUNITY)
Admission: RE | Admit: 2012-02-15 | Discharge: 2012-02-15 | Disposition: A | Discharge status: Home / Self Care | Payer: Self-pay | Source: Ambulatory Visit | Attending: Interventional Cardiology | Admitting: Interventional Cardiology

## 2012-02-17 ENCOUNTER — Encounter (HOSPITAL_COMMUNITY)
Admission: RE | Admit: 2012-02-17 | Discharge: 2012-02-17 | Disposition: A | Discharge status: Home / Self Care | Payer: Self-pay | Source: Ambulatory Visit | Attending: Interventional Cardiology | Admitting: Interventional Cardiology

## 2012-02-20 ENCOUNTER — Encounter (HOSPITAL_COMMUNITY)
Admission: RE | Admit: 2012-02-20 | Discharge: 2012-02-20 | Disposition: A | Discharge status: Home / Self Care | Payer: Self-pay | Source: Ambulatory Visit | Attending: Interventional Cardiology | Admitting: Interventional Cardiology

## 2012-02-22 ENCOUNTER — Encounter (HOSPITAL_COMMUNITY)
Admission: RE | Admit: 2012-02-22 | Discharge: 2012-02-22 | Disposition: A | Discharge status: Home / Self Care | Payer: Self-pay | Source: Ambulatory Visit | Attending: Interventional Cardiology | Admitting: Interventional Cardiology

## 2012-02-24 ENCOUNTER — Encounter (HOSPITAL_COMMUNITY)
Admission: RE | Admit: 2012-02-24 | Discharge: 2012-02-24 | Disposition: A | Discharge status: Home / Self Care | Payer: Self-pay | Source: Ambulatory Visit | Attending: Interventional Cardiology | Admitting: Interventional Cardiology

## 2012-02-27 ENCOUNTER — Encounter (HOSPITAL_COMMUNITY)
Admission: RE | Admit: 2012-02-27 | Discharge: 2012-02-27 | Disposition: A | Discharge status: Home / Self Care | Payer: Self-pay | Source: Ambulatory Visit | Attending: Interventional Cardiology | Admitting: Interventional Cardiology

## 2012-02-29 ENCOUNTER — Encounter (HOSPITAL_COMMUNITY)
Admission: RE | Admit: 2012-02-29 | Discharge: 2012-02-29 | Disposition: A | Discharge status: Home / Self Care | Payer: Self-pay | Source: Ambulatory Visit | Attending: Interventional Cardiology | Admitting: Interventional Cardiology

## 2012-03-02 ENCOUNTER — Encounter (HOSPITAL_COMMUNITY)
Admission: RE | Admit: 2012-03-02 | Discharge: 2012-03-02 | Disposition: A | Discharge status: Home / Self Care | Payer: Self-pay | Source: Ambulatory Visit | Attending: Interventional Cardiology | Admitting: Interventional Cardiology

## 2012-03-05 ENCOUNTER — Encounter (HOSPITAL_COMMUNITY)
Admission: RE | Admit: 2012-03-05 | Discharge: 2012-03-05 | Disposition: A | Discharge status: Home / Self Care | Payer: Self-pay | Source: Ambulatory Visit | Attending: Interventional Cardiology | Admitting: Interventional Cardiology

## 2012-03-07 ENCOUNTER — Encounter (HOSPITAL_COMMUNITY)
Admission: RE | Admit: 2012-03-07 | Discharge: 2012-03-07 | Disposition: A | Discharge status: Home / Self Care | Payer: Self-pay | Source: Ambulatory Visit | Attending: Interventional Cardiology | Admitting: Interventional Cardiology

## 2012-03-07 DIAGNOSIS — Z9861 Coronary angioplasty status: Secondary | ICD-10-CM | POA: Insufficient documentation

## 2012-03-07 DIAGNOSIS — I2510 Atherosclerotic heart disease of native coronary artery without angina pectoris: Secondary | ICD-10-CM | POA: Insufficient documentation

## 2012-03-07 DIAGNOSIS — Z5189 Encounter for other specified aftercare: Secondary | ICD-10-CM | POA: Insufficient documentation

## 2012-03-07 DIAGNOSIS — I10 Essential (primary) hypertension: Secondary | ICD-10-CM | POA: Insufficient documentation

## 2012-03-07 DIAGNOSIS — I200 Unstable angina: Secondary | ICD-10-CM | POA: Insufficient documentation

## 2012-03-09 ENCOUNTER — Encounter (HOSPITAL_COMMUNITY)
Admission: RE | Admit: 2012-03-09 | Discharge: 2012-03-09 | Disposition: A | Discharge status: Home / Self Care | Payer: Self-pay | Source: Ambulatory Visit | Attending: Interventional Cardiology | Admitting: Interventional Cardiology

## 2012-03-12 ENCOUNTER — Encounter (HOSPITAL_COMMUNITY)
Admission: RE | Admit: 2012-03-12 | Discharge: 2012-03-12 | Disposition: A | Discharge status: Home / Self Care | Payer: Self-pay | Source: Ambulatory Visit | Attending: Interventional Cardiology | Admitting: Interventional Cardiology

## 2012-03-14 ENCOUNTER — Encounter (HOSPITAL_COMMUNITY)
Admission: RE | Admit: 2012-03-14 | Discharge: 2012-03-14 | Disposition: A | Discharge status: Home / Self Care | Payer: Self-pay | Source: Ambulatory Visit | Attending: Interventional Cardiology | Admitting: Interventional Cardiology

## 2012-03-16 ENCOUNTER — Encounter (HOSPITAL_COMMUNITY)
Admission: RE | Admit: 2012-03-16 | Discharge: 2012-03-16 | Disposition: A | Discharge status: Home / Self Care | Payer: Self-pay | Source: Ambulatory Visit | Attending: Interventional Cardiology | Admitting: Interventional Cardiology

## 2012-03-19 ENCOUNTER — Encounter (HOSPITAL_COMMUNITY)
Admission: RE | Admit: 2012-03-19 | Discharge: 2012-03-19 | Disposition: A | Discharge status: Home / Self Care | Payer: Self-pay | Source: Ambulatory Visit | Attending: Interventional Cardiology | Admitting: Interventional Cardiology

## 2012-03-21 ENCOUNTER — Encounter (HOSPITAL_COMMUNITY)
Admission: RE | Admit: 2012-03-21 | Discharge: 2012-03-21 | Disposition: A | Discharge status: Home / Self Care | Payer: Self-pay | Source: Ambulatory Visit | Attending: Interventional Cardiology | Admitting: Interventional Cardiology

## 2012-03-23 ENCOUNTER — Encounter (HOSPITAL_COMMUNITY): Payer: Self-pay

## 2012-03-26 ENCOUNTER — Encounter (HOSPITAL_COMMUNITY)
Admission: RE | Admit: 2012-03-26 | Discharge: 2012-03-26 | Disposition: A | Discharge status: Home / Self Care | Payer: Self-pay | Source: Ambulatory Visit | Attending: Interventional Cardiology | Admitting: Interventional Cardiology

## 2012-03-28 ENCOUNTER — Encounter (HOSPITAL_COMMUNITY)
Admission: RE | Admit: 2012-03-28 | Discharge: 2012-03-28 | Disposition: A | Discharge status: Home / Self Care | Payer: Self-pay | Source: Ambulatory Visit | Attending: Interventional Cardiology | Admitting: Interventional Cardiology

## 2012-03-30 ENCOUNTER — Encounter (HOSPITAL_COMMUNITY)
Admission: RE | Admit: 2012-03-30 | Discharge: 2012-03-30 | Disposition: A | Discharge status: Home / Self Care | Payer: Self-pay | Source: Ambulatory Visit | Attending: Interventional Cardiology | Admitting: Interventional Cardiology

## 2012-04-01 IMAGING — CR DG CHEST 2V
2 series · 2 of 2 positions shown · non-contrast
Comparison: 09/14/2010

CLINICAL DATA: Chest pressure and pain

CHEST - 2 VIEW

[w chest pa]
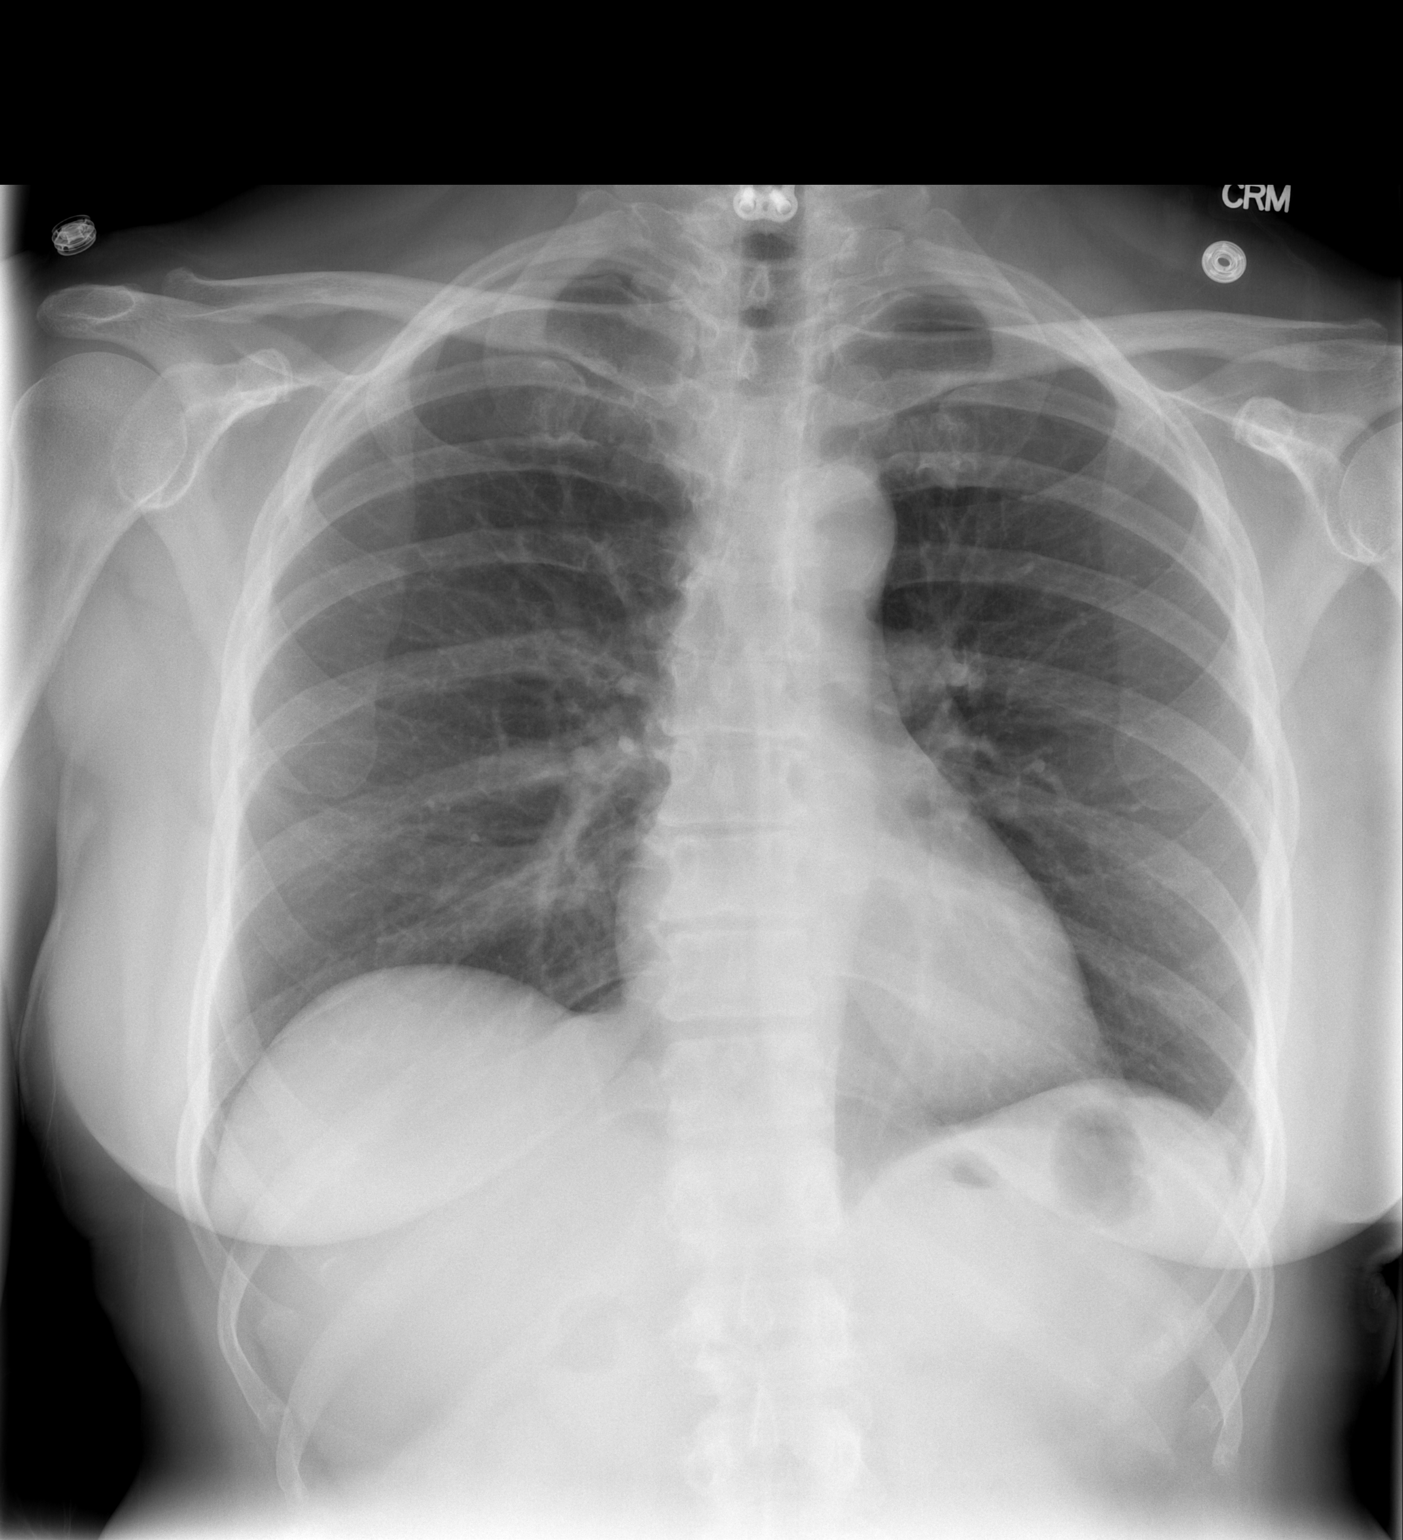

[w chest lat]
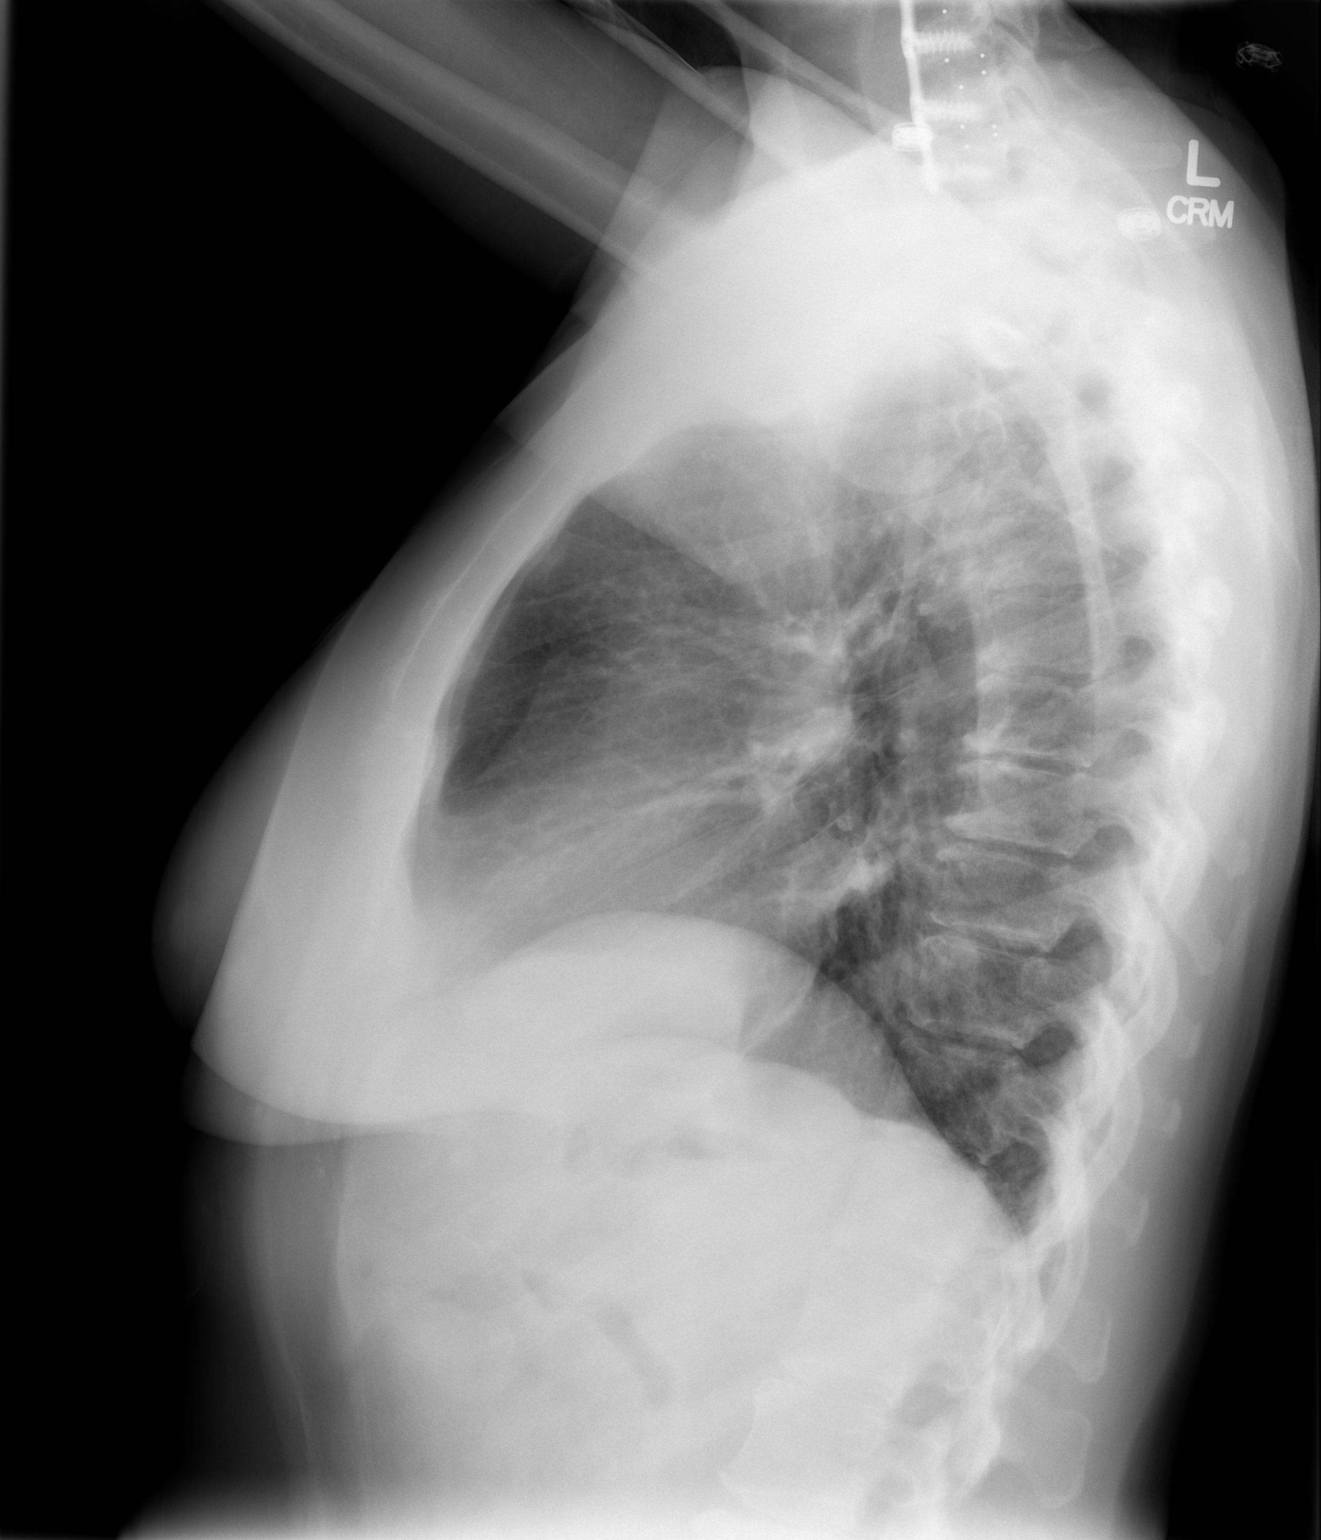

[2 of 2 positions shown; findings below may reference images not displayed]

FINDINGS: Cardiac and mediastinal contours are normal.  Pulmonary
vascularity is normal.  Lungs are clear without infiltrate or
effusion.  Prior cervical multilevel fusion.
IMPRESSION: No active cardiopulmonary disease.

## 2012-04-02 ENCOUNTER — Encounter (HOSPITAL_COMMUNITY): Payer: Self-pay

## 2012-04-04 ENCOUNTER — Encounter (HOSPITAL_COMMUNITY)
Admission: RE | Admit: 2012-04-04 | Discharge: 2012-04-04 | Disposition: A | Discharge status: Home / Self Care | Payer: Self-pay | Source: Ambulatory Visit | Attending: Interventional Cardiology | Admitting: Interventional Cardiology

## 2012-04-06 ENCOUNTER — Encounter (HOSPITAL_COMMUNITY): Payer: Self-pay

## 2012-04-09 ENCOUNTER — Encounter (HOSPITAL_COMMUNITY)
Admission: RE | Admit: 2012-04-09 | Discharge: 2012-04-09 | Disposition: A | Discharge status: Home / Self Care | Payer: Self-pay | Source: Ambulatory Visit | Attending: Interventional Cardiology | Admitting: Interventional Cardiology

## 2012-04-09 DIAGNOSIS — Z5189 Encounter for other specified aftercare: Secondary | ICD-10-CM | POA: Insufficient documentation

## 2012-04-09 DIAGNOSIS — I200 Unstable angina: Secondary | ICD-10-CM | POA: Insufficient documentation

## 2012-04-09 DIAGNOSIS — Z9861 Coronary angioplasty status: Secondary | ICD-10-CM | POA: Insufficient documentation

## 2012-04-09 DIAGNOSIS — I10 Essential (primary) hypertension: Secondary | ICD-10-CM | POA: Insufficient documentation

## 2012-04-09 DIAGNOSIS — I2510 Atherosclerotic heart disease of native coronary artery without angina pectoris: Secondary | ICD-10-CM | POA: Insufficient documentation

## 2012-04-11 ENCOUNTER — Encounter (HOSPITAL_COMMUNITY)
Admission: RE | Admit: 2012-04-11 | Discharge: 2012-04-11 | Disposition: A | Discharge status: Home / Self Care | Payer: Self-pay | Source: Ambulatory Visit | Attending: Interventional Cardiology | Admitting: Interventional Cardiology

## 2012-04-13 ENCOUNTER — Encounter (HOSPITAL_COMMUNITY)
Admission: RE | Admit: 2012-04-13 | Discharge: 2012-04-13 | Disposition: A | Discharge status: Home / Self Care | Payer: Self-pay | Source: Ambulatory Visit | Attending: Interventional Cardiology | Admitting: Interventional Cardiology

## 2012-04-16 ENCOUNTER — Encounter (HOSPITAL_COMMUNITY)
Admission: RE | Admit: 2012-04-16 | Discharge: 2012-04-16 | Disposition: A | Discharge status: Home / Self Care | Payer: Self-pay | Source: Ambulatory Visit | Attending: Interventional Cardiology | Admitting: Interventional Cardiology

## 2012-04-18 ENCOUNTER — Encounter (HOSPITAL_COMMUNITY): Payer: Self-pay

## 2012-04-20 ENCOUNTER — Encounter (HOSPITAL_COMMUNITY)
Admission: RE | Admit: 2012-04-20 | Discharge: 2012-04-20 | Disposition: A | Discharge status: Home / Self Care | Payer: Self-pay | Source: Ambulatory Visit | Attending: Interventional Cardiology | Admitting: Interventional Cardiology

## 2012-04-23 ENCOUNTER — Encounter (HOSPITAL_COMMUNITY): Payer: Self-pay

## 2012-04-25 ENCOUNTER — Encounter (HOSPITAL_COMMUNITY): Payer: Self-pay

## 2012-04-27 ENCOUNTER — Encounter (HOSPITAL_COMMUNITY): Payer: Self-pay

## 2012-04-30 ENCOUNTER — Encounter (HOSPITAL_COMMUNITY): Payer: Self-pay

## 2012-05-02 ENCOUNTER — Encounter (HOSPITAL_COMMUNITY)
Admission: RE | Admit: 2012-05-02 | Discharge: 2012-05-02 | Disposition: A | Discharge status: Home / Self Care | Payer: Self-pay | Source: Ambulatory Visit | Attending: Interventional Cardiology | Admitting: Interventional Cardiology

## 2012-05-04 ENCOUNTER — Encounter (HOSPITAL_COMMUNITY): Payer: Self-pay

## 2012-05-07 ENCOUNTER — Encounter (HOSPITAL_COMMUNITY): Payer: 59

## 2012-05-09 ENCOUNTER — Encounter (HOSPITAL_COMMUNITY): Payer: 59

## 2012-05-11 ENCOUNTER — Encounter (HOSPITAL_COMMUNITY): Payer: 59

## 2012-05-14 ENCOUNTER — Encounter (HOSPITAL_COMMUNITY): Payer: 59

## 2012-05-16 ENCOUNTER — Encounter (HOSPITAL_COMMUNITY): Payer: 59

## 2012-05-18 ENCOUNTER — Encounter (HOSPITAL_COMMUNITY): Payer: 59

## 2012-05-21 ENCOUNTER — Encounter (HOSPITAL_COMMUNITY): Payer: 59

## 2012-05-23 ENCOUNTER — Encounter (HOSPITAL_COMMUNITY): Payer: 59

## 2012-05-25 ENCOUNTER — Encounter (HOSPITAL_COMMUNITY): Payer: 59

## 2012-05-28 ENCOUNTER — Encounter (HOSPITAL_COMMUNITY): Payer: 59

## 2012-05-30 ENCOUNTER — Encounter (HOSPITAL_COMMUNITY): Payer: 59

## 2012-06-01 ENCOUNTER — Encounter (HOSPITAL_COMMUNITY): Payer: 59

## 2012-06-04 ENCOUNTER — Encounter (HOSPITAL_COMMUNITY): Payer: 59

## 2012-06-06 ENCOUNTER — Encounter (HOSPITAL_COMMUNITY): Payer: 59

## 2012-06-08 ENCOUNTER — Encounter (HOSPITAL_COMMUNITY): Payer: 59

## 2012-06-11 ENCOUNTER — Encounter (HOSPITAL_COMMUNITY): Payer: 59

## 2012-06-13 ENCOUNTER — Encounter (HOSPITAL_COMMUNITY): Payer: 59

## 2012-06-15 ENCOUNTER — Encounter (HOSPITAL_COMMUNITY): Payer: 59

## 2012-06-18 ENCOUNTER — Encounter (HOSPITAL_COMMUNITY): Payer: 59

## 2012-06-20 ENCOUNTER — Encounter (HOSPITAL_COMMUNITY): Payer: 59

## 2012-06-22 ENCOUNTER — Encounter (HOSPITAL_COMMUNITY): Payer: 59

## 2012-06-25 ENCOUNTER — Encounter (HOSPITAL_COMMUNITY): Payer: 59

## 2012-06-27 ENCOUNTER — Encounter (HOSPITAL_COMMUNITY): Payer: 59

## 2012-06-29 ENCOUNTER — Encounter (HOSPITAL_COMMUNITY): Payer: 59

## 2012-07-02 ENCOUNTER — Encounter (HOSPITAL_COMMUNITY): Payer: 59

## 2012-07-04 ENCOUNTER — Encounter (HOSPITAL_COMMUNITY): Payer: 59

## 2012-07-06 ENCOUNTER — Encounter (HOSPITAL_COMMUNITY): Payer: 59

## 2012-07-09 ENCOUNTER — Encounter (HOSPITAL_COMMUNITY): Payer: 59

## 2012-07-11 ENCOUNTER — Encounter (HOSPITAL_COMMUNITY): Payer: 59

## 2012-07-13 ENCOUNTER — Encounter (HOSPITAL_COMMUNITY): Payer: 59

## 2012-07-16 ENCOUNTER — Encounter (HOSPITAL_COMMUNITY): Payer: 59

## 2012-07-18 ENCOUNTER — Encounter (HOSPITAL_COMMUNITY): Payer: 59

## 2012-07-20 ENCOUNTER — Encounter (HOSPITAL_COMMUNITY): Payer: 59

## 2012-07-23 ENCOUNTER — Encounter (HOSPITAL_COMMUNITY): Payer: 59

## 2012-07-25 ENCOUNTER — Encounter (HOSPITAL_COMMUNITY): Payer: 59

## 2012-07-27 ENCOUNTER — Encounter (HOSPITAL_COMMUNITY): Payer: 59

## 2012-07-30 ENCOUNTER — Encounter (HOSPITAL_COMMUNITY): Payer: 59

## 2012-08-01 ENCOUNTER — Encounter (HOSPITAL_COMMUNITY): Payer: 59

## 2012-08-03 ENCOUNTER — Encounter (HOSPITAL_COMMUNITY): Payer: 59

## 2012-08-06 ENCOUNTER — Encounter (HOSPITAL_COMMUNITY): Payer: 59

## 2012-08-08 ENCOUNTER — Encounter (HOSPITAL_COMMUNITY): Payer: 59

## 2012-08-10 ENCOUNTER — Encounter (HOSPITAL_COMMUNITY): Payer: 59

## 2012-08-13 ENCOUNTER — Encounter (HOSPITAL_COMMUNITY): Payer: 59

## 2012-08-15 ENCOUNTER — Encounter (HOSPITAL_COMMUNITY): Payer: 59

## 2012-08-17 ENCOUNTER — Encounter (HOSPITAL_COMMUNITY): Payer: 59

## 2012-08-20 ENCOUNTER — Encounter (HOSPITAL_COMMUNITY): Payer: 59

## 2012-08-22 ENCOUNTER — Encounter (HOSPITAL_COMMUNITY): Payer: 59

## 2012-08-24 ENCOUNTER — Encounter (HOSPITAL_COMMUNITY): Payer: 59

## 2012-08-27 ENCOUNTER — Encounter (HOSPITAL_COMMUNITY): Payer: 59

## 2012-08-29 ENCOUNTER — Encounter (HOSPITAL_COMMUNITY): Payer: 59

## 2012-08-31 ENCOUNTER — Encounter (HOSPITAL_COMMUNITY): Payer: 59

## 2012-09-03 ENCOUNTER — Encounter (HOSPITAL_COMMUNITY): Payer: 59

## 2012-09-05 ENCOUNTER — Encounter (HOSPITAL_COMMUNITY): Payer: 59

## 2012-09-07 ENCOUNTER — Encounter (HOSPITAL_COMMUNITY): Payer: 59

## 2012-09-10 ENCOUNTER — Encounter (HOSPITAL_COMMUNITY): Payer: 59

## 2012-09-12 ENCOUNTER — Encounter (HOSPITAL_COMMUNITY): Payer: 59

## 2012-09-14 ENCOUNTER — Encounter (HOSPITAL_COMMUNITY): Payer: 59

## 2012-09-17 ENCOUNTER — Encounter (HOSPITAL_COMMUNITY): Payer: 59

## 2012-09-19 ENCOUNTER — Encounter (HOSPITAL_COMMUNITY): Payer: 59

## 2012-09-21 ENCOUNTER — Encounter (HOSPITAL_COMMUNITY): Payer: 59

## 2012-09-24 ENCOUNTER — Encounter (HOSPITAL_COMMUNITY): Payer: 59

## 2012-09-26 ENCOUNTER — Encounter (HOSPITAL_COMMUNITY): Payer: 59

## 2012-09-28 ENCOUNTER — Encounter (HOSPITAL_COMMUNITY): Payer: 59

## 2012-10-01 ENCOUNTER — Encounter (HOSPITAL_COMMUNITY): Payer: 59

## 2012-10-03 ENCOUNTER — Encounter (HOSPITAL_COMMUNITY): Payer: 59

## 2012-10-05 ENCOUNTER — Encounter (HOSPITAL_COMMUNITY): Payer: 59

## 2012-10-08 ENCOUNTER — Encounter (HOSPITAL_COMMUNITY): Payer: 59

## 2012-10-10 ENCOUNTER — Encounter (HOSPITAL_COMMUNITY): Payer: 59

## 2012-10-12 ENCOUNTER — Encounter (HOSPITAL_COMMUNITY): Payer: 59

## 2012-10-15 ENCOUNTER — Encounter (HOSPITAL_COMMUNITY): Payer: 59

## 2012-10-17 ENCOUNTER — Encounter (HOSPITAL_COMMUNITY): Payer: 59

## 2012-10-19 ENCOUNTER — Encounter (HOSPITAL_COMMUNITY): Payer: 59

## 2012-10-22 ENCOUNTER — Encounter (HOSPITAL_COMMUNITY): Payer: 59

## 2012-10-24 ENCOUNTER — Encounter (HOSPITAL_COMMUNITY): Payer: 59

## 2012-10-26 ENCOUNTER — Encounter (HOSPITAL_COMMUNITY): Payer: 59

## 2012-10-29 ENCOUNTER — Encounter (HOSPITAL_COMMUNITY): Payer: 59

## 2012-10-31 ENCOUNTER — Encounter (HOSPITAL_COMMUNITY): Payer: 59

## 2012-11-02 ENCOUNTER — Encounter (HOSPITAL_COMMUNITY): Payer: 59

## 2012-11-05 ENCOUNTER — Encounter (HOSPITAL_COMMUNITY): Payer: 59

## 2013-01-07 ENCOUNTER — Emergency Department (HOSPITAL_COMMUNITY): Payer: 59

## 2013-01-07 ENCOUNTER — Encounter (HOSPITAL_COMMUNITY): Payer: Self-pay | Admitting: Emergency Medicine

## 2013-01-07 ENCOUNTER — Observation Stay (HOSPITAL_COMMUNITY)
Admission: EM | Admit: 2013-01-07 | Discharge: 2013-01-08 | Disposition: A | Discharge status: Home / Self Care | Payer: 59 | Attending: Interventional Cardiology | Admitting: Interventional Cardiology

## 2013-01-07 DIAGNOSIS — E782 Mixed hyperlipidemia: Secondary | ICD-10-CM | POA: Diagnosis present

## 2013-01-07 DIAGNOSIS — I2510 Atherosclerotic heart disease of native coronary artery without angina pectoris: Principal | ICD-10-CM | POA: Diagnosis present

## 2013-01-07 DIAGNOSIS — R079 Chest pain, unspecified: Secondary | ICD-10-CM | POA: Diagnosis present

## 2013-01-07 DIAGNOSIS — E785 Hyperlipidemia, unspecified: Secondary | ICD-10-CM | POA: Diagnosis present

## 2013-01-07 DIAGNOSIS — I10 Essential (primary) hypertension: Secondary | ICD-10-CM | POA: Diagnosis present

## 2013-01-07 DIAGNOSIS — R0789 Other chest pain: Secondary | ICD-10-CM | POA: Insufficient documentation

## 2013-01-07 DIAGNOSIS — Z9861 Coronary angioplasty status: Secondary | ICD-10-CM | POA: Insufficient documentation

## 2013-01-07 HISTORY — DX: Presence of coronary angioplasty implant and graft: Z95.5

## 2013-01-07 LAB — URINALYSIS, ROUTINE W REFLEX MICROSCOPIC
Glucose, UA: NEGATIVE mg/dL
Protein, ur: NEGATIVE mg/dL
pH: 7.5 (ref 5.0–8.0)

## 2013-01-07 LAB — COMPREHENSIVE METABOLIC PANEL
Albumin: 3.5 g/dL (ref 3.5–5.2)
BUN: 10 mg/dL (ref 6–23)
Creatinine, Ser: 0.71 mg/dL (ref 0.50–1.10)
GFR calc Af Amer: >90 mL/min (ref >90)
Total Bilirubin: 0.3 mg/dL (ref 0.3–1.2)
Total Protein: 6.5 g/dL (ref 6.0–8.3)

## 2013-01-07 LAB — BASIC METABOLIC PANEL
CO2: 26 mEq/L (ref 19–32)
Chloride: 105 mEq/L (ref 96–112)
Glucose, Bld: 103 mg/dL — ABNORMAL HIGH (ref 70–99)
Potassium: 4.1 mEq/L (ref 3.5–5.1)
Sodium: 139 mEq/L (ref 135–145)

## 2013-01-07 LAB — CBC WITH DIFFERENTIAL/PLATELET
Basophils Absolute: 0 10*3/uL (ref 0.0–0.1)
Basophils Relative: 1 % (ref 0–1)
Eosinophils Absolute: 0 10*3/uL (ref 0.0–0.7)
Eosinophils Relative: 0 % (ref 0–5)
HCT: 34.2 % — ABNORMAL LOW (ref 36.0–46.0)
Lymphocytes Relative: 19 % (ref 12–46)
Lymphs Abs: 0.9 10*3/uL (ref 0.7–4.0)
MCH: 30.5 pg (ref 26.0–34.0)
MCHC: 34.5 g/dL (ref 30.0–36.0)
MCV: 88.4 fL (ref 78.0–100.0)
Monocytes Absolute: 0.4 10*3/uL (ref 0.1–1.0)
Neutrophils Relative %: 72 % (ref 43–77)
Platelets: 248 10*3/uL (ref 150–400)
RBC: 3.64 MIL/uL — ABNORMAL LOW (ref 3.87–5.11)
RDW: 13 % (ref 11.5–15.5)
WBC: 4.8 10*3/uL (ref 4.0–10.5)

## 2013-01-07 LAB — URINE MICROSCOPIC-ADD ON

## 2013-01-07 LAB — PRO B NATRIURETIC PEPTIDE: Pro B Natriuretic peptide (BNP): 216.8 pg/mL — ABNORMAL HIGH (ref 0–125)

## 2013-01-07 LAB — APTT: aPTT: 33 seconds (ref 24–37)

## 2013-01-07 LAB — PLATELET INHIBITION P2Y12: Platelet Function  P2Y12: 312 [PRU] (ref 194–418)

## 2013-01-07 LAB — TROPONIN I: Troponin I: <0.3 ng/mL (ref <0.30)

## 2013-01-07 LAB — HEMOGLOBIN A1C: Hgb A1c MFr Bld: 5.8 % — ABNORMAL HIGH (ref <5.7)

## 2013-01-07 MED ORDER — ONDANSETRON HCL 4 MG/2ML IJ SOLN: 4.0000 mg | Freq: Four times a day (QID) | INTRAMUSCULAR | Status: DC | PRN

## 2013-01-07 MED ORDER — ASPIRIN 81 MG PO CHEW: 324.0000 mg | CHEWABLE_TABLET | ORAL | Status: DC

## 2013-01-07 MED ORDER — ASPIRIN EC 81 MG PO TBEC
81.0000 mg | DELAYED_RELEASE_TABLET | Freq: Every day | ORAL | Status: DC
  Administered 2013-01-08: 81 mg via ORAL
  Filled 2013-01-07: qty 1

## 2013-01-07 MED ORDER — ASPIRIN 300 MG RE SUPP
300.0000 mg | RECTAL | Status: DC
  Filled 2013-01-07: qty 1

## 2013-01-07 MED ORDER — VITAMIN D3 25 MCG (1000 UT) PO CAPS: 1000.0000 [IU] | ORAL_CAPSULE | Freq: Every day | ORAL | Status: DC

## 2013-01-07 MED ORDER — CLOPIDOGREL BISULFATE 75 MG PO TABS
75.0000 mg | ORAL_TABLET | Freq: Every day | ORAL | Status: DC
  Administered 2013-01-07: 75 mg via ORAL
  Filled 2013-01-07: qty 1

## 2013-01-07 MED ORDER — CLOPIDOGREL BISULFATE 75 MG PO TABS: 75.0000 mg | ORAL_TABLET | Freq: Once | ORAL | Status: DC

## 2013-01-07 MED ORDER — ONE-DAILY MULTI VITAMINS PO TABS: 1.0000 | ORAL_TABLET | Freq: Every day | ORAL | Status: DC

## 2013-01-07 MED ORDER — ENOXAPARIN SODIUM 60 MG/0.6ML ~~LOC~~ SOLN
1.0000 mg/kg | Freq: Two times a day (BID) | SUBCUTANEOUS | Status: DC
  Filled 2013-01-07 (×2): qty 0.6

## 2013-01-07 MED ORDER — ADULT MULTIVITAMIN W/MINERALS CH
1.0000 | ORAL_TABLET | Freq: Every day | ORAL | Status: DC
  Administered 2013-01-08: 1 via ORAL
  Filled 2013-01-07 (×2): qty 1

## 2013-01-07 MED ORDER — SIMVASTATIN 20 MG PO TABS
20.0000 mg | ORAL_TABLET | Freq: Every day | ORAL | Status: DC
  Administered 2013-01-07: 20 mg via ORAL
  Filled 2013-01-07 (×2): qty 1

## 2013-01-07 MED ORDER — VITAMIN D3 25 MCG (1000 UNIT) PO TABS
1000.0000 [IU] | ORAL_TABLET | Freq: Every day | ORAL | Status: DC
  Administered 2013-01-08: 1000 [IU] via ORAL
  Filled 2013-01-07 (×2): qty 1

## 2013-01-07 MED ORDER — NITROGLYCERIN 0.4 MG SL SUBL: 0.4000 mg | SUBLINGUAL_TABLET | SUBLINGUAL | Status: DC | PRN

## 2013-01-07 MED ORDER — MORPHINE SULFATE 4 MG/ML IJ SOLN: 4.0000 mg | Freq: Once | INTRAMUSCULAR | Status: DC

## 2013-01-07 MED ORDER — LISINOPRIL 5 MG PO TABS
5.0000 mg | ORAL_TABLET | Freq: Every day | ORAL | Status: DC
  Administered 2013-01-08: 5 mg via ORAL
  Filled 2013-01-07 (×2): qty 1

## 2013-01-07 MED ORDER — ACETAMINOPHEN 325 MG PO TABS: 650.0000 mg | ORAL_TABLET | ORAL | Status: DC | PRN

## 2013-01-07 MED ORDER — SODIUM CHLORIDE 0.9 % IV BOLUS (SEPSIS): 1000.0000 mL | Freq: Once | INTRAVENOUS | Status: AC

## 2013-01-07 NOTE — ED Notes (Signed)
Pt via EMS for eval of CP. Onset of CP around 0830, bilat subclavical. Took 1 SL NTG and 324 ASA. EMS gave 2 more SL NTG.  Pain was 8/10 and now 1/10.  Pain similar to to pain experienced in Aug 2012 - nothing found in ED but 90% blockage found during cardiac cath and stent placed. Pt anxious about CP because of her hx.

## 2013-01-07 NOTE — Progress Notes (Signed)
ANTICOAGULATION CONSULT NOTE - Initial Consult  Pharmacy Consult for lovenox Indication: chest pain/ACS  No Known Allergies  Patient Measurements: Height: 5\' 2"  (157.5 cm) Weight: 133 lb (60.328 kg) IBW/kg (Calculated) : 50.1  Vital Signs: Temp: 97.8 F (36.6 C) (03/03 1530) Temp src: Oral (03/03 1530) BP: 146/54 mmHg (03/03 1530) Pulse Rate: 63 (03/03 1530)  Labs:  Recent Labs  01/07/13 1032  HGB 11.3*  HCT 32.2*  PLT 248  CREATININE 0.72  TROPONINI <0.30    Estimated Creatinine Clearance: 60.8 ml/min (by C-G formula based on Cr of 0.72).   Medical History: Past Medical History  Diagnosis Date  . Hypertension   . Stented coronary artery     Medications:  Prescriptions prior to admission  Medication Sig Dispense Refill  . aspirin EC 81 MG tablet Take 81 mg by mouth daily.        . Cholecalciferol (VITAMIN D3) 1000 UNITS CAPS Take 1,000 Units by mouth daily.       . clopidogrel (PLAVIX) 75 MG tablet Take 75 mg by mouth daily.      Marland Kitchen lisinopril (PRINIVIL,ZESTRIL) 10 MG tablet Take 5 mg by mouth daily.      . Multiple Vitamin (MULTIVITAMIN) tablet Take 1 tablet by mouth daily.        Marland Kitchen OVER THE COUNTER MEDICATION Take 1 tablet by mouth every other day. Calcium / Magnesium tablet      . simvastatin (ZOCOR) 20 MG tablet Take 20 mg by mouth at bedtime.          Assessment: 44 yof with complaints of chest discomfort to start on lovenox. She was on aspirin and plavix PTA (she recently changed to this from brilinta). Baseline H/H is slightly low but plts are WNL. Adequate renal function for full dose lovenox.   Goal of Therapy:  Anti-Xa level 0.6-1.2 units/ml 4hrs after LMWH dose given Monitor platelets by anticoagulation protocol: Yes   Plan:  1. Lovenox 60mg  SQ Q12H 2. CBC Q72H while on lovenox 3. F/u cardiology plans  Yarima Penman, Drake Leach 01/07/2013,4:19 PM

## 2013-01-07 NOTE — ED Provider Notes (Signed)
History     CSN: [id number]  Arrival date & time 01/07/13  1002   First MD Initiated Contact with Patient 01/07/13 1018      Chief Complaint  Patient presents with  . Chest Pain    (Consider location/radiation/quality/duration/timing/severity/associated sxs/prior treatment) HPI Comments: Pt with known CAD, s/p stent placement august 2012 comes in with cc of chest pain. The chest pain is sharp, left and right sided, suprasternal, non radiating. The pain has no specific precipitating, aggravating/relieving factors - no nausea, dib, diaphoresis. Pain started around 8:30 unprovoked, and was constant. She got multiple nitro SL, and the pain has subsided almost completely now. No recent cardiac testing. No hx of DVT, PE, and no risk factors for the same.  Patient is a 67 y.o. female presenting with chest pain. The history is provided by the patient and medical records.  Chest Pain Associated symptoms: no abdominal pain, no cough, no nausea, no shortness of breath and not vomiting     Past Medical History  Diagnosis Date  . Hypertension   . Stented coronary artery     History reviewed. No pertinent past surgical history.  No family history on file.  History  Substance Use Topics  . Smoking status: Not on file  . Smokeless tobacco: Not on file  . Alcohol Use: Not on file    OB History   Grav Para Term Preterm Abortions TAB SAB Ect Mult Living                  Review of Systems  Constitutional: Negative for activity change.  HENT: Negative for facial swelling and neck pain.   Respiratory: Negative for cough, shortness of breath and wheezing.   Cardiovascular: Positive for chest pain.  Gastrointestinal: Negative for nausea, vomiting, abdominal pain, diarrhea, constipation, blood in stool and abdominal distention.  Genitourinary: Negative for hematuria and difficulty urinating.  Skin: Negative for color change.  Neurological: Negative for speech difficulty.  Hematological:  Does not bruise/bleed easily.  Psychiatric/Behavioral: Negative for confusion.    Allergies  Review of patient's allergies indicates no known allergies.  Home Medications   Current Outpatient Rx  Name  Route  Sig  Dispense  Refill  . aspirin EC 81 MG tablet   Oral   Take 81 mg by mouth daily.           . Cholecalciferol (VITAMIN D3) 1000 UNITS CAPS   Oral   Take 1,000 Units by mouth daily.          . clopidogrel (PLAVIX) 75 MG tablet   Oral   Take 75 mg by mouth daily.         Marland Kitchen lisinopril (PRINIVIL,ZESTRIL) 10 MG tablet   Oral   Take 5 mg by mouth daily.         . Multiple Vitamin (MULTIVITAMIN) tablet   Oral   Take 1 tablet by mouth daily.           Marland Kitchen OVER THE COUNTER MEDICATION   Oral   Take 1 tablet by mouth every other day. Calcium / Magnesium tablet         . simvastatin (ZOCOR) 20 MG tablet   Oral   Take 20 mg by mouth at bedtime.             BP 158/72  Pulse 66  Resp 22  SpO2 100%  Physical Exam  Nursing note and vitals reviewed. Constitutional: She is oriented to person, place, and time. She  appears well-developed.  HENT:  Head: Normocephalic and atraumatic.  Eyes: Conjunctivae and EOM are normal. Pupils are equal, round, and reactive to light.  Neck: Normal range of motion. Neck supple. No JVD present.  Cardiovascular: Normal rate, regular rhythm and normal heart sounds.   Pulmonary/Chest: Effort normal and breath sounds normal. No respiratory distress.  Abdominal: Soft. Bowel sounds are normal. She exhibits no distension. There is no tenderness. There is no rebound and no guarding.  Neurological: She is alert and oriented to person, place, and time.  Skin: Skin is warm and dry.    ED Course  Procedures (including critical care time)  Labs Reviewed  CBC WITH DIFFERENTIAL - Abnormal; Notable for the following:    RBC 3.64 (*)    Hemoglobin 11.3 (*)    HCT 32.2 (*)    All other components within normal limits  BASIC METABOLIC  PANEL - Abnormal; Notable for the following:    Glucose, Bld 103 (*)    GFR calc non Af Amer 89 (*)    All other components within normal limits  TROPONIN I  URINALYSIS, ROUTINE W REFLEX MICROSCOPIC   Dg Chest Port 1 View  01/07/2013  *RADIOLOGY REPORT*  Clinical Data: Chest pain  CHEST - 1 VIEW  Comparison:  06/06/2011  Findings: The heart size and mediastinal contours are within normal limits.  Both lungs are clear.  Lower cervical fusion hardware noted.  Diffuse thoracic spondylosis evident.  IMPRESSION: No active disease.   Original Report Authenticated By: Judie Petit. Miles Costain, M.D.      No diagnosis found.    MDM   Date: 01/07/2013  Rate: 68  Rhythm: normal sinus rhythm  QRS Axis: normal  Intervals: normal  ST/T Wave abnormalities: normal  Conduction Disutrbances: none  Narrative Interpretation: unremarkable  Differential diagnosis includes: ACS syndrome CHF exacerbation Valvular disorder Myocarditis Pericarditis Pericardial effusion Pneumonia Pleural effusion Pulmonary edema PE Anemia Musculoskeletal pain Dissection  Pt comes in with cc of chest pain. Atypical - but she had atypical chest pain with her 905 LAD blockage as well. Pain responding to nitro. Initial EKG shows no acute findings and troponin is negative as well. We will get serial troponin and consult Cardiology to get further recommendations.      Derwood Kaplan, MD 01/07/13 1254

## 2013-01-07 NOTE — ED Notes (Signed)
Pt states she does not want Morphine at this time.  Pt informed to press call bell if she changes her mind or the pain increases.

## 2013-01-07 NOTE — H&P (Signed)
[name] is a 67 y.o. female  Admit date: 01/07/2013 Referring Physician: Johnella Moloney Primary Cardiologist:: Everette Rank, MD Chief complaint / reason for admission: Chest discomfort  HPI: 30 year old with the son knows that of upper chest subclavicular and precordial chest discomfort rated 7 on a 10 scale. The discomfort is been continuous. Nitroglycerin seemed to help relieve it. She felt a very mild episode when she began her walk yesterday. It resolved spontaneously. There is no associated shortness of breath, diaphoresis, palpitation, or syncope. She is pain-free at this time. The discomfort is vaguely similar to her symptoms prior to stenting of the LAD in 2012, August.  She recently had her antiplatelet therapy switch to Plavix and aspirin from Brilinta (3 days ago).    PMH:    Past Medical History  Diagnosis Date  . Hypertension   . Stented coronary artery     PSH:    Past Surgical History  Procedure Laterality Date  . Cardiac catheterization      ALLERGIES:   Review of patient's allergies indicates no known allergies.  Prior to Admit Meds:   (Not in a hospital admission) Family HX:    Family History  Problem Relation Age of Onset  . CVA Mother   . Congestive Heart Failure Mother   . Other Father    Social HX:    History   Social History  . Marital Status: Married    Spouse Name: N/A    Number of Children: N/A  . Years of Education: N/A   Occupational History  . Not on file.   Social History Main Topics  . Smoking status: Not on file  . Smokeless tobacco: Not on file  . Alcohol Use: 4.2 oz/week    7 Glasses of wine per week  . Drug Use: Not on file  . Sexually Active: Yes   Other Topics Concern  . Not on file   Social History Narrative  . No narrative on file     ROS: She voices no specific other complaints. She denies palpitations. No neurological symptoms. She has had strict compliance with her medical regimen . No bleeding from her  intestinal tract or gums. She denies abdominal discomfort, dyspnea, rash, or change in urinary frequency.  Physical Exam: Blood pressure 158/72, pulse 66, resp. rate 22, SpO2 100.00%.    The patient is in no distress. Lying comfortably on the emergency room gurney.  HEENT exam is unremarkable.  NECK exam reveals no JVD. No bruits are heard.  CHEST exam is unremarkable. No wheezing is heard.  CARDIAC exam reveals no murmur, rub, or gallop. Rhythm is regular.  ABDOMEN is nontender. Bowel sounds are normal. No organomegaly is palpated.  EXTREMITIES: Pulses are 2+ and symmetric in the femorals and posterior tibials bilaterally. Radial pulses also 2+.  NEUROLOGICAL exam is intact without motor sensory deficits. Labs:   Lab Results  Component Value Date   WBC 4.8 01/07/2013   HGB 11.3* 01/07/2013   HCT 32.2* 01/07/2013   MCV 88.5 01/07/2013   PLT 248 01/07/2013    Recent Labs Lab 01/07/13 1032  NA 139  K 4.1  CL 105  CO2 26  BUN 13  CREATININE 0.72  CALCIUM 9.0  GLUCOSE 103*   Lab Results  Component Value Date   CKTOTAL 67 06/06/2011   CKMB 1.8 06/06/2011   TROPONINI <0.30 01/07/2013     Radiology:  No active disease is noted  EKG:  Normal appearing  ASSESSMENT:   1. Recurrence of chest pain that is similar to pre-stenting chest discomfort. This also correlates with a recent change in antiplatelet therapy 72-96 hours ago. There is no evidence of acute ischemia based upon EKG. The patient's symptoms have resolved.  2. History of hypertension  3. Hyperlipidemia.  Plan:  1. Will check a P2 Y12 assay to rule out Plavix hyperresponsiveness  2. Continue all medications as per her usual home regimen  3. Subcutaneous Lovenox  4. Nuclear testing versus catheterization per [facility] Rosina Lowenstein 01/07/2013 1:30 PM

## 2013-01-08 ENCOUNTER — Encounter (HOSPITAL_COMMUNITY): Payer: Self-pay | Admitting: *Deleted

## 2013-01-08 LAB — CBC
MCV: 89.3 fL (ref 78.0–100.0)
Platelets: 268 10*3/uL (ref 150–400)
RBC: 3.83 MIL/uL — ABNORMAL LOW (ref 3.87–5.11)
RDW: 13.1 % (ref 11.5–15.5)
WBC: 6.2 10*3/uL (ref 4.0–10.5)

## 2013-01-08 LAB — BASIC METABOLIC PANEL
CO2: 30 mEq/L (ref 19–32)
Calcium: 9.2 mg/dL (ref 8.4–10.5)
Chloride: 107 mEq/L (ref 96–112)
Creatinine, Ser: 0.76 mg/dL (ref 0.50–1.10)
GFR calc Af Amer: >90 mL/min (ref >90)
Sodium: 142 mEq/L (ref 135–145)

## 2013-01-08 LAB — TROPONIN I: Troponin I: <0.3 ng/mL (ref <0.30)

## 2013-01-08 MED ORDER — NITROGLYCERIN 0.4 MG SL SUBL: 0.4000 mg | SUBLINGUAL_TABLET | SUBLINGUAL | Status: DC | PRN

## 2013-01-08 NOTE — Progress Notes (Signed)
Pt provided with dc instructions and education. Pt verbalized understanding. Pt has no needs at this time. IV removed with tip intact. Heart monitor cleaned and returned to front. Levonne Spiller, RN

## 2013-01-08 NOTE — Discharge Summary (Signed)
Patient ID: [name] MRN: [id number] DOB/AGE: 1946-08-29 67 y.o.  Admit date: 01/07/2013 Discharge date: 01/08/2013  Primary Discharge DiagnosisCAD Secondary Discharge Diagnosis:atypical chest pain  Significant Diagnostic Studies: none  Consults: None  Hospital Course: 67 y/o with prior LAD stent who was admitted with chest discomfort.  It was a pressure across her chest that lasted 30 minutes.  It was somewhat different than her prior angina before her stent.  She did a water aerobics class on Saturday where she had to push herself up out of the pool.  She has some residual soreness from that.  She also ate [place] food the day before the CP which is very unusual for her.  While in the hospital, she had no problems on telemetry.  No further CP .  No problems with walking the halls. ECG and enzymes were normal.  We discussed various options including stress test vs. Cath.  Given no objective evidence of ischemia, we opted for stress test as an out patient.  She was comfortable with this.    Discharge Exam: Blood pressure 117/59, pulse 59, temperature 98.1 F (36.7 C), temperature source Oral, resp. rate 18, height 5\' 2"  (1.575 m), weight 60.2 kg (132 lb 11.5 oz), SpO2 99.00%.   [place]/AT RRR,S1S2 No wheezing Nondistended No edema Mild shoulder soreness to palpation Labs:   Lab Results  Component Value Date   WBC 6.2 01/08/2013   HGB 11.7* 01/08/2013   HCT 34.2* 01/08/2013   MCV 89.3 01/08/2013   PLT 268 01/08/2013    Recent Labs Lab 01/07/13 1556 01/08/13 0305  NA 139 142  K 3.9 4.1  CL 105 107  CO2 28 30  BUN 10 12  CREATININE 0.71 0.76  CALCIUM 9.1 9.2  PROT 6.5  --   BILITOT 0.3  --   ALKPHOS 62  --   ALT 14  --   AST 21  --   GLUCOSE 118* 103*   Lab Results  Component Value Date   CKTOTAL 67 06/06/2011   CKMB 1.8 06/06/2011   TROPONINI <0.30 01/08/2013    No results found for this basename: CHOL   No results found for this basename: HDL   No results found for  this basename: LDLCALC   No results found for this basename: TRIG   No results found for this basename: CHOLHDL   No results found for this basename: LDLDIRECT      Radiology: CXR without active disease EKG:NSR, No ST segment changes  FOLLOW UP PLANS AND APPOINTMENTS    Medication List    STOP taking these medications       lisinopril 10 MG tablet  Commonly known as:  PRINIVIL,ZESTRIL      TAKE these medications       aspirin EC 81 MG tablet  Take 81 mg by mouth daily.     clopidogrel 75 MG tablet  Commonly known as:  PLAVIX  Take 75 mg by mouth daily.     multivitamin tablet  Take 1 tablet by mouth daily.     nitroGLYCERIN 0.4 MG SL tablet  Commonly known as:  NITROSTAT  Place 1 tablet (0.4 mg total) under the tongue every 5 (five) minutes x 3 doses as needed for chest pain.     OVER THE COUNTER MEDICATION  Take 1 tablet by mouth every other day. Calcium / Magnesium tablet     simvastatin 20 MG tablet  Commonly known as:  ZOCOR  Take 20 mg by mouth  at bedtime.     Vitamin D3 1000 UNITS Caps  Take 1,000 Units by mouth daily.           Follow-up Information   Follow up with Corky Crafts., MD. (for stress test)    Contact information:   [address] [place] [address] [phone]       BRING ALL MEDICATIONS WITH YOU TO FOLLOW UP APPOINTMENTS  Time spent with patient to include physician time: 25 minutes Signed: VARANASI,JAYADEEP S. 01/08/2013, 10:07 AM

## 2013-09-12 ENCOUNTER — Other Ambulatory Visit: Payer: Self-pay

## 2013-09-19 ENCOUNTER — Encounter: Payer: Self-pay | Admitting: Interventional Cardiology

## 2013-11-03 IMAGING — CR DG CHEST 1V PORT
1 series · 1 of 1 positions shown · non-contrast
Comparison: 06/06/2011

CLINICAL DATA: Chest pain

CHEST - 1 VIEW

[AP]
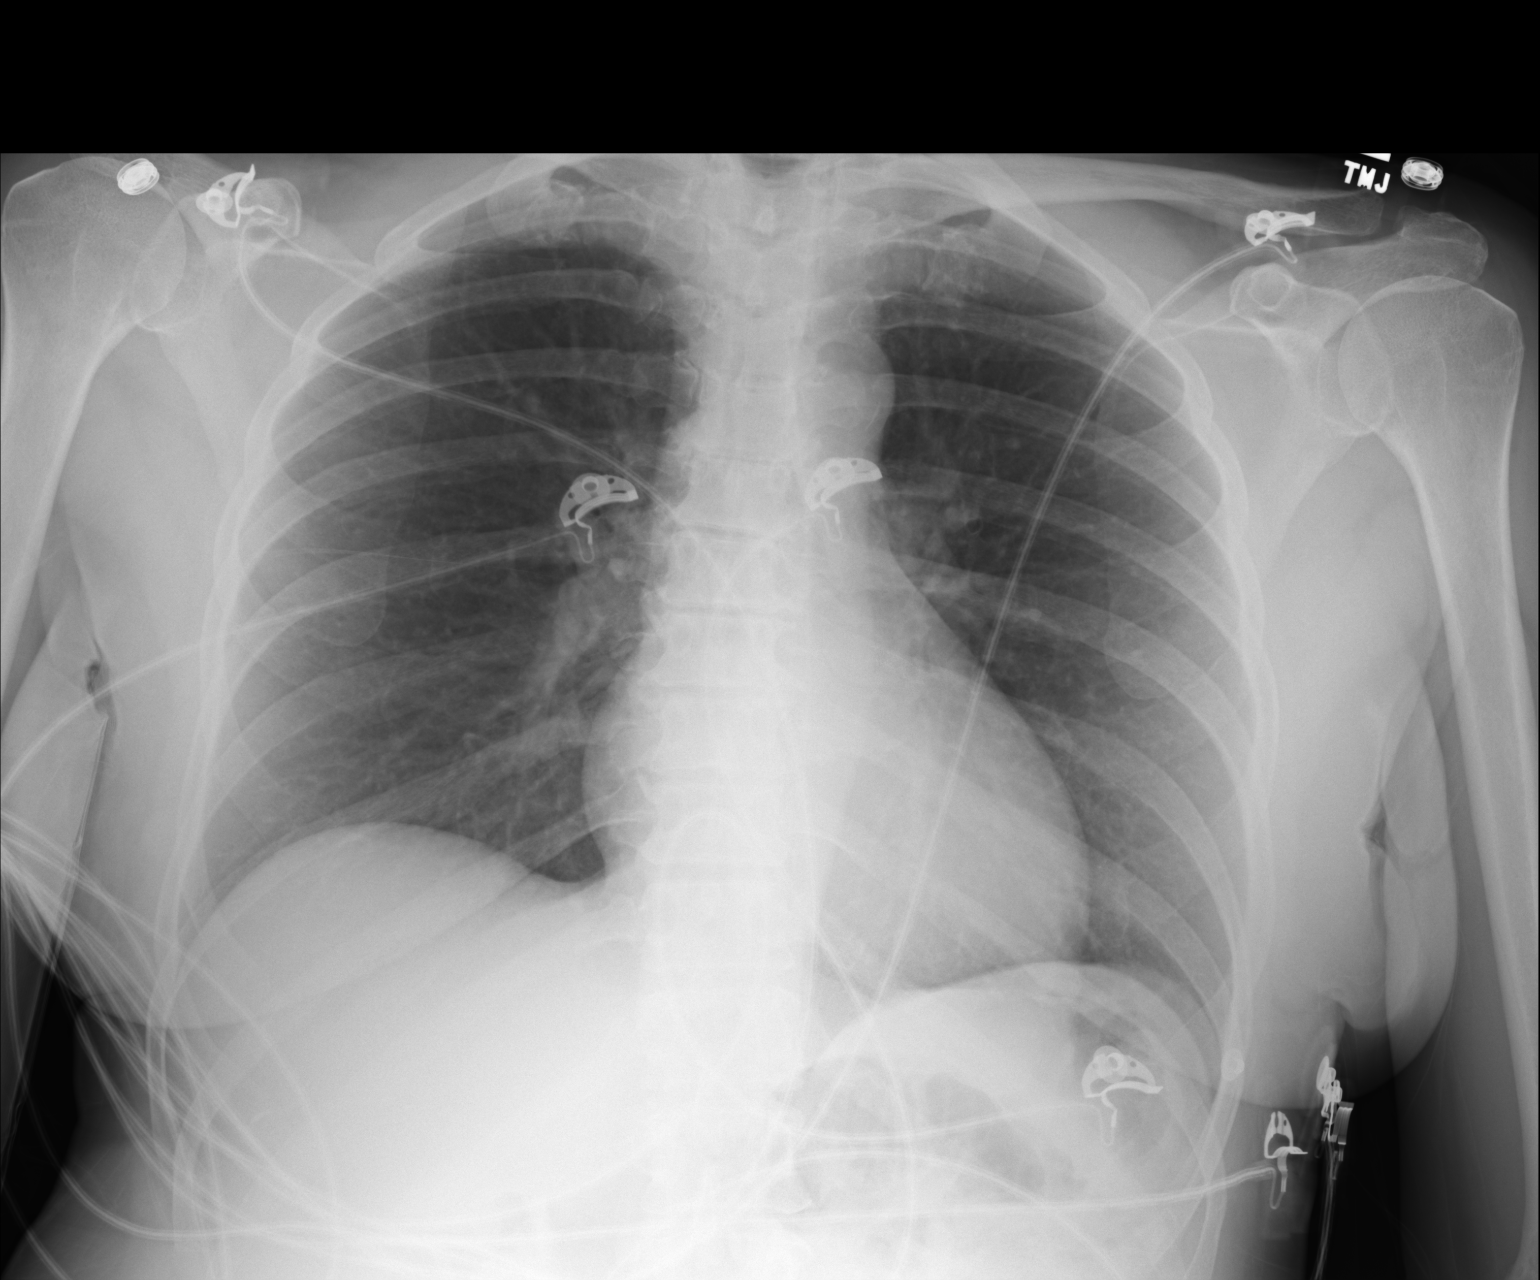

[1 of 1 positions shown; findings below may reference images not displayed]

FINDINGS: The heart size and mediastinal contours are within normal
limits.  Both lungs are clear.  Lower cervical fusion hardware
noted.  Diffuse thoracic spondylosis evident.
IMPRESSION: No active disease.

## 2013-12-11 ENCOUNTER — Other Ambulatory Visit: Payer: Self-pay | Admitting: Interventional Cardiology

## 2014-01-02 ENCOUNTER — Ambulatory Visit: Payer: 59 | Admitting: Interventional Cardiology

## 2014-01-06 ENCOUNTER — Ambulatory Visit: Payer: 59 | Admitting: Interventional Cardiology

## 2014-01-22 ENCOUNTER — Other Ambulatory Visit: Payer: Self-pay | Admitting: Interventional Cardiology

## 2014-02-03 ENCOUNTER — Encounter: Payer: Self-pay | Admitting: Cardiology

## 2014-02-03 ENCOUNTER — Encounter: Payer: Self-pay | Admitting: Interventional Cardiology

## 2014-02-03 ENCOUNTER — Ambulatory Visit (INDEPENDENT_AMBULATORY_CARE_PROVIDER_SITE_OTHER): Payer: Commercial Managed Care - PPO | Admitting: Interventional Cardiology

## 2014-02-03 VITALS — BP 126/76 | HR 68 | Ht 62.0 in | Wt 136.0 lb

## 2014-02-03 DIAGNOSIS — E785 Hyperlipidemia, unspecified: Secondary | ICD-10-CM

## 2014-02-03 DIAGNOSIS — I2510 Atherosclerotic heart disease of native coronary artery without angina pectoris: Secondary | ICD-10-CM

## 2014-02-03 DIAGNOSIS — I10 Essential (primary) hypertension: Secondary | ICD-10-CM

## 2014-02-03 NOTE — Patient Instructions (Signed)
Your physician recommends that you continue on your current medications as directed. Please refer to the Current Medication list given to you today.  Your physician wants you to follow-up in: 1 year with Dr. Varanasi. You will receive a reminder letter in the mail two months in advance. If you don't receive a letter, please call our office to schedule the follow-up appointment.  

## 2014-02-03 NOTE — Progress Notes (Signed)
Patient ID: Kristin FamJanet K Salinger, female   DOB: 04/15/1946, 68 y.o.   MRN: [id number]    [address] [place], [place]  [address] Phone: [phone] Fax:  [phone]  Date:  02/03/2014   ID:  Kristin FamJanet K Ulrey, DOB 04/15/1946, MRN [id number]  PCP:  Pearla DubonnetGATES,ROBERT NEVILL, MD      History of Present Illness: [name] is a 68 y.o. female who had a LAD stent. She has not had any symptoms like what she had before her stent. She exercises 5-6 days a week for 30 minutes a day. Hypertension:  c/o Dizziness while getting up from sitting position.  Denies : Chest pain right-sided, different than prior angina, has resolved.  Leg edema.  Paroxysmal nocturnal dyspnea.  Palpitations.  Shortness of breath.  Dyspnea.  Syncope.     Wt Readings from Last 3 Encounters:  02/03/14 136 lb (61.689 kg)  01/08/13 132 lb 11.5 oz (60.2 kg)     Past Medical History  Diagnosis Date  . Hypertension   . Stented coronary artery   . DJD (degenerative joint disease), cervical     followed by Dr. Darlin CocoJoseph Stern-surgery planned for 2011  . Family history of colon cancer     Current Outpatient Prescriptions  Medication Sig Dispense Refill  . aspirin EC 81 MG tablet Take 81 mg by mouth daily.        . Cholecalciferol (VITAMIN D3) 1000 UNITS CAPS Take 1,000 Units by mouth daily.       . clopidogrel (PLAVIX) 75 MG tablet take 1 tablet by mouth once daily  30 tablet  1  . lisinopril (PRINIVIL,ZESTRIL) 5 MG tablet Take 5 mg by mouth daily.      . Multiple Vitamin (MULTIVITAMIN) tablet Take 1 tablet by mouth daily.        . nitroGLYCERIN (NITROSTAT) 0.4 MG SL tablet Place 1 tablet (0.4 mg total) under the tongue every 5 (five) minutes x 3 doses as needed for chest pain.  25 tablet  6  . OVER THE COUNTER MEDICATION Take 1 tablet by mouth every other day. Calcium / Magnesium tablet      . simvastatin (ZOCOR) 20 MG tablet take 1 tablet by mouth once daily  30 tablet  1   No current facility-administered  medications for this visit.    Allergies:   No Known Allergies  Social History:  The patient  reports that she has never smoked. She does not have any smokeless tobacco history on file. She reports that she drinks about 4.2 ounces of alcohol per week. She reports that she does not use illicit drugs.   Family History:  The patient's family history includes CVA in her mother; Congestive Heart Failure in her mother; Other in her father.   ROS:  Please see the history of present illness.  No nausea, vomiting.  No fevers, chills.  No focal weakness.  No dysuria.    All other systems reviewed and negative.   PHYSICAL EXAM: VS:  BP 126/76  Pulse 68  Ht 5\' 2"  (1.575 m)  Wt 136 lb (61.689 kg)  BMI 24.87 kg/m2 Well nourished, well developed, in no acute distress HEENT: normal Neck: no JVD, no carotid bruits Cardiac:  normal S1, S2; RRR;  Lungs:  clear to auscultation bilaterally, no wheezing, rhonchi or rales Abd: soft, nontender, no hepatomegaly Ext: no edema Skin: warm and dry Neuro:   no focal abnormalities noted  EKG:  normal  ASSESSMENT AND PLAN:  Coronary atherosclerosis of native coronary artery  Continue Aspir-81 Tablet Delayed Release, 81 MG, 1 tablet, Orally, Once a day Start Clopidogrel Bisulfate Tablet, 75 MG, 1 tablet, Orally, Once a day, 30 day(s), 30, Refills 11 Diagnostic Imaging:EKG Harward,Amy 12/28/2012 10:16:24 AM > Tira Lafferty,JAY 12/28/2012 10:44:49 AM > NSR, no significant ST segment changes  No typical angina. Dual antiplatelet with Plavix. Stopped brilinta.If sx like what she had prior to her stent, she will let us know.    2. Essential hypertension, benign  Holding Lisinopril Tablet, 10 MG, half tablet, Orally, Once a day, 90 days, 90, Refills 3 during cough. Restarting lisinopril in a few days.  If cough gets worse, could switch to ARB.  She will let us know. lightheadednesss improved with less medicine. BP controlled today. Continuing cardiac rehab.    3.  Hypercholesteremia  Continue Simvastatin Tablet, 20 MG, 1 tablet every evening, Orally, Once a day Continuing exercise and dietary changes.  LDL 68 in 2/14; HDL 98 at that time.  TO be checked next week with Dr. Kevan Ny.   Preventive Medicine  Adult topics discussed:  Diet: healthy diet.  Exercise: at least 30 minutes of aerobic exercise, 5 days a week.      Signed, Fredric Mare, MD, [facility] 02/03/2014 3:19 PM

## 2014-02-19 ENCOUNTER — Other Ambulatory Visit: Payer: Self-pay

## 2014-02-19 MED ORDER — SIMVASTATIN 20 MG PO TABS: ORAL_TABLET | ORAL | Status: DC

## 2014-03-10 ENCOUNTER — Other Ambulatory Visit: Payer: Self-pay | Admitting: Interventional Cardiology

## 2014-03-24 ENCOUNTER — Other Ambulatory Visit: Payer: Self-pay | Admitting: *Deleted

## 2014-03-24 MED ORDER — CLOPIDOGREL BISULFATE 75 MG PO TABS: ORAL_TABLET | ORAL | Status: DC

## 2014-06-16 ENCOUNTER — Telehealth: Payer: Self-pay | Admitting: Interventional Cardiology

## 2014-06-16 NOTE — Telephone Encounter (Signed)
°  Patient has questions regarding medication? She wants to make sure its okay to take fish oil. Please call and advise.

## 2014-06-16 NOTE — Telephone Encounter (Signed)
lmtrc

## 2014-06-16 NOTE — Telephone Encounter (Signed)
Pt notified. Pt will try fish oil. Meds updated.

## 2014-06-16 NOTE — Telephone Encounter (Signed)
Given he is on plavix and aspirin, I would not recommend taking either Aleve or tumeric given risk for internal bleeding with this.  Taking 1,000 mg of fish oil once daily would be okay.  Could consider Voltaren gel as it is topical and will work as an Counselloranti-inflammatory agent without increasing bleed or cardiovascular risk.

## 2014-06-16 NOTE — Telephone Encounter (Addendum)
Spoke with pt and Dr. Amanda PeaGramig wants to pt take fish oil and tumeric for joint inflammation and/or aleve. Riki RuskJeremy would this be okay?

## 2014-08-25 ENCOUNTER — Encounter: Payer: Self-pay | Admitting: Podiatry

## 2014-08-25 ENCOUNTER — Ambulatory Visit (INDEPENDENT_AMBULATORY_CARE_PROVIDER_SITE_OTHER): Payer: Commercial Managed Care - PPO

## 2014-08-25 ENCOUNTER — Ambulatory Visit (INDEPENDENT_AMBULATORY_CARE_PROVIDER_SITE_OTHER): Payer: Commercial Managed Care - PPO | Admitting: Podiatry

## 2014-08-25 VITALS — BP 136/68 | HR 62 | Resp 12 | Ht 61.5 in | Wt 135.0 lb

## 2014-08-25 DIAGNOSIS — M2012 Hallux valgus (acquired), left foot: Secondary | ICD-10-CM

## 2014-08-25 DIAGNOSIS — M79672 Pain in left foot: Secondary | ICD-10-CM

## 2014-08-25 NOTE — Progress Notes (Signed)
   Subjective:    Patient ID: [name], female    DOB: [birth year], 68 y.o.   MRN: [id number]  HPI Comments: N bunion L left 1st MPJ D over 2 weeks O episodes C painful, red, enlarged A enclosed shoes T none  This patient presents complaining of intermittent pain the left great toe joint area over her 3 year. Aggravated with standing and walking and relieved rest and soft shoes. More recently he the left great toe joint has become more symptomatic. Patient walks for exercise on a regular basis wearing walking shoes   Review of Systems  Musculoskeletal: Positive for arthralgias.  All other systems reviewed and are negative.      Objective:   Physical Exam  Orientated x3  Vascular: DP and PT pulses 2/4 bilaterally Capillary reflex immediate bilaterally  Neurological: Knee and ankle reflex equal and reactive bilaterally  Dermatological: Low-grade edema over medial left first MPJ  Musculoskeletal: HAV deformities left more dense than right No pain or crepitus on range of motion first MPJ bilaterally Tailor bunion deformity bilaterally  X-ray examination weightbearing left foot  Intact bony structure without fracture and/or dislocation  HAV deformity  Increased soft tissue density noted AP view about medial condyle had a first metatarsal  Inferior calcaneal spur  Radiographic impression  HAV deformity left  No acute bony abnormality noted in left foot      Assessment & Plan:   Assessment: Symptomatic HAV deformity left foot  Plan: Discuss treatment options for HAV/bunions including conservative care, such as wide soft shoes, padding and reduction of activity to tolerance Also, made patient aware of surgical options, however, I am not recommending surgical treatment at this time and unless conservative care does not reduce patient's symptoms.  Patient provided general information about HAV/bunion deformities  Reappoint at patient's request

## 2014-08-25 NOTE — Patient Instructions (Signed)
Bunion (Hallux Valgus) A bony bump (protrusion) on the inside of the foot, at the base of the first toe, is called a bunion (hallux valgus). A bunion causes the first toe to angle toward the other toes. SYMPTOMS   A bony bump on the inside of the foot, causing an outward turning of the first toe. It may also overlap the second toe.  Thickening of the skin (callus) over the bony bump.  Fluid buildup under the callus. Fluid may become red, tender, and swollen (inflamed) with constant irritation or pressure.  Foot pain and stiffness. CAUSES  Many causes exist, including:  Inherited from your family (genetics).  Injury (trauma) forcing the first toe into a position in which it overlaps other toes.  Bunions are also associated with wearing shoes that have a narrow toe box (pointy shoes). RISK INCREASES WITH:  Family history of foot abnormalities, especially bunions.  Arthritis.  Narrow shoes, especially high heels. PREVENTION  Wear shoes with a wide toe box.  Avoid shoes with high heels.  Wear a small pad between the big toe and second toe.  Maintain proper conditioning:  Foot and ankle flexibility.  Muscle strength and endurance. PROGNOSIS  With proper treatment, bunions can typically be cured. Occasionally, surgery is required.  RELATED COMPLICATIONS   Infection of the bunion.  Arthritis of the first toe.  Risks of surgery, including infection, bleeding, injury to nerves (numb toe), recurrent bunion, overcorrection (toe points inward), arthritis of the big toe, big toe pointing upward, and bone not healing. TREATMENT  Treatment first consists of stopping the activities that aggravate the pain, taking pain medicines, and icing to reduce inflammation and pain. Wear shoes with a wide toe box. Shoes can be modified by a shoe repair person to relieve pressure on the bunion, especially if you cannot find shoes with a wide enough toe box. You may also place a pad with the  center cut out in your shoe, to reduce pressure on the bunion. Sometimes, an arch support (orthotic) may reduce pressure on the bunion and alleviate the symptoms. Stretching and strengthening exercises for the muscles of the foot may be useful. You may choose to wear a brace or pad at night to hold the big toe away from the second toe. If non-surgical treatments are not successful, surgery may be needed. Surgery involves removing the overgrown tissue and correcting the position of the first toe, by realigning the bones. Bunion surgery is typically performed on an outpatient basis, meaning you can go home the same day as surgery. The surgery may involve cutting the mid portion of the bone of the first toe, or just cutting and repairing (reconstructing) the ligaments and soft tissues around the first toe.  MEDICATION   If pain medicine is needed, nonsteroidal anti-inflammatory medicines, such as aspirin and ibuprofen, or other minor pain relievers, such as acetaminophen, are often recommended.  Do not take pain medicine for 7 days before surgery.  Prescription pain relievers are usually only prescribed after surgery. Use only as directed and only as much as you need.  Ointments applied to the skin may be helpful. HEAT AND COLD  Cold treatment (icing) relieves pain and reduces inflammation. Cold treatment should be applied for 10 to 15 minutes every 2 to 3 hours for inflammation and pain and immediately after any activity that aggravates your symptoms. Use ice packs or an ice massage.  Heat treatment may be used prior to performing the stretching and strengthening activities prescribed by your   caregiver, physical therapist, or athletic trainer. Use a heat pack or a warm soak. SEEK MEDICAL CARE IF:   Symptoms get worse or do not improve in 2 weeks, despite treatment.  After surgery, you develop fever, increasing pain, redness, swelling, drainage of fluids, bleeding, or increasing warmth around the  surgical area.  New, unexplained symptoms develop. (Drugs used in treatment may produce side effects.) Document Released: 10/24/2005 Document Revised: 01/16/2012 Document Reviewed: 02/05/2009 [facility] Patient Information 2015 [place], [place]. This information is not intended to replace advice given to you by your health care provider. Make sure you discuss any questions you have with your health care provider. Bunion You have a bunion deformity of the feet. This is more common in women. It tends to be an inherited problem. Symptoms can include pain, swelling, and deformity around the great toe. Numbness and tingling may also be present. Your symptoms are often worsened by wearing shoes that cause pressure on the bunion. Changing the type of shoes you wear helps reduce symptoms. A wide shoe decreases pressure on the bunion. An arch support may be used if you have flat feet. Avoid shoes with heels higher than two inches. This puts more pressure on the bunion. X-rays may be helpful in evaluating the severity of the problem. Other foot problems often seen with bunions include corns, calluses, and hammer toes. If the deformity or pain is severe, surgical treatment may be necessary. Keep off your painful foot as much as possible until the pain is relieved. Call your caregiver if your symptoms are worse.  SEEK IMMEDIATE MEDICAL CARE IF:  You have increased redness, pain, swelling, or other symptoms of infection. Document Released: 10/24/2005 Document Revised: 01/16/2012 Document Reviewed: 04/23/2007 [facility] Patient Information 2015 [place], [place]. This information is not intended to replace advice given to you by your health care provider. Make sure you discuss any questions you have with your health care provider.

## 2014-08-26 ENCOUNTER — Encounter: Payer: Self-pay | Admitting: Podiatry

## 2014-09-22 ENCOUNTER — Other Ambulatory Visit: Payer: Self-pay | Admitting: *Deleted

## 2014-09-22 MED ORDER — SIMVASTATIN 20 MG PO TABS: ORAL_TABLET | ORAL | Status: DC

## 2014-09-22 MED ORDER — CLOPIDOGREL BISULFATE 75 MG PO TABS: ORAL_TABLET | ORAL | Status: DC

## 2015-01-08 ENCOUNTER — Other Ambulatory Visit: Payer: Self-pay

## 2015-01-08 MED ORDER — LISINOPRIL 5 MG PO TABS: 5.0000 mg | ORAL_TABLET | Freq: Every day | ORAL | Status: DC

## 2015-02-17 ENCOUNTER — Ambulatory Visit (INDEPENDENT_AMBULATORY_CARE_PROVIDER_SITE_OTHER): Payer: Commercial Managed Care - PPO | Admitting: Interventional Cardiology

## 2015-02-17 ENCOUNTER — Encounter: Payer: Self-pay | Admitting: Interventional Cardiology

## 2015-02-17 VITALS — BP 140/70 | HR 62 | Ht 62.0 in | Wt 138.1 lb

## 2015-02-17 DIAGNOSIS — E785 Hyperlipidemia, unspecified: Secondary | ICD-10-CM

## 2015-02-17 DIAGNOSIS — I10 Essential (primary) hypertension: Secondary | ICD-10-CM | POA: Diagnosis not present

## 2015-02-17 DIAGNOSIS — I2510 Atherosclerotic heart disease of native coronary artery without angina pectoris: Secondary | ICD-10-CM | POA: Diagnosis not present

## 2015-02-17 NOTE — Progress Notes (Signed)
Patient ID: JOEANNA HOWDYSHELL, female   DOB: 1946-08-30, 69 y.o.   MRN: [id number]    [address] [place], [place]  [address] Phone: [phone] Fax:  [phone]  Date:  02/17/2015   ID:  BREANNA SHORKEY, DOB [birth year], MRN [id number]  PCP:  Pearla Dubonnet, MD      History of Present Illness: GWYNNE KEMNITZ is a 69 y.o. female who had a LAD stent in 2013. She has not had any symptoms like what she had before her stent. She exercises 5-6 days a week for 30 minutes a day. Hypertension:  c/o Dizziness while getting up from sitting position.  Denies : Chest pain right-sided, different than prior angina, has resolved.  Leg edema.  Paroxysmal nocturnal dyspnea.  Palpitations.  Shortness of breath.  Dyspnea.  Syncope.   She has not had symptoms like she had prior to her angioplasty. Her lightheadedness has resolved on the lower dose of lisinopril. She is not checking her blood pressure regularly outside of the doctor's office. Her readings at other doctor's appointments have been well controlled.  Wt Readings from Last 3 Encounters:  02/17/15 138 lb 1.9 oz (62.651 kg)  08/25/14 135 lb (61.236 kg)  02/03/14 136 lb (61.689 kg)     Past Medical History  Diagnosis Date  . Hypertension   . Stented coronary artery   . DJD (degenerative joint disease), cervical     followed by Dr. Darlin Coco planned for 2011  . Family history of colon cancer     Current Outpatient Prescriptions  Medication Sig Dispense Refill  . aspirin EC 81 MG tablet Take 81 mg by mouth daily.      . Cholecalciferol (VITAMIN D3) 1000 UNITS CAPS Take 1,000 Units by mouth daily.     . clopidogrel (PLAVIX) 75 MG tablet take 1 tablet by mouth once daily 30 tablet 5  . estradiol (ESTRACE) 0.1 MG/GM vaginal cream Place 1 Applicatorful vaginally once a week.    Marland Kitchen lisinopril (PRINIVIL,ZESTRIL) 5 MG tablet Take 1 tablet (5 mg total) by mouth daily. 30 tablet 3  . Multiple Vitamin (MULTIVITAMIN)  tablet Take 1 tablet by mouth daily.      . nitroGLYCERIN (NITROSTAT) 0.4 MG SL tablet Place 1 tablet (0.4 mg total) under the tongue every 5 (five) minutes x 3 doses as needed for chest pain. 25 tablet 6  . Omega-3 Fatty Acids (FISH OIL) 1000 MG CAPS Take 1,000 mg by mouth daily.    Marland Kitchen OVER THE COUNTER MEDICATION Take 1 tablet by mouth every other day. Calcium / Magnesium tablet    . simvastatin (ZOCOR) 20 MG tablet take 1 tablet by mouth once daily 30 tablet 5   No current facility-administered medications for this visit.    Allergies:   No Known Allergies  Social History:  The patient  reports that she has never smoked. She does not have any smokeless tobacco history on file. She reports that she drinks about 4.2 oz of alcohol per week. She reports that she does not use illicit drugs.   Family History:  The patient's family history includes CVA in her mother; Congestive Heart Failure in her mother; Other in her father.   ROS:  Please see the history of present illness.  No nausea, vomiting.  No fevers, chills.  No focal weakness.  No dysuria.    All other systems reviewed and negative.   PHYSICAL EXAM: VS:  BP 140/70 mmHg  Pulse 62  Ht 5\' 2"  (1.575 m)  Wt 138 lb 1.9 oz (62.651 kg)  BMI 25.26 kg/m2 Well nourished, well developed, in no acute distress HEENT: normal Neck: no JVD, no carotid bruits Cardiac:  normal S1, S2; RRR;  Lungs:  clear to auscultation bilaterally, no wheezing, rhonchi or rales Abd: soft, nontender, no hepatomegaly Ext: no edema Skin: warm and dry Neuro:   no focal abnormalities noted Psych: normal affect  EKG:  normal  , no ST segment changes  ASSESSMENT AND PLAN:  Coronary atherosclerosis of native coronary artery  Stop Aspir-81 Tablet Delayed Release, 81 MG, 1 tablet, Orally, Once a day Start Clopidogrel Bisulfate Tablet, 75 MG, 1 tablet, Orally, Once a day, 30 day(s), 30, Refills 11  No typical angina. Dual antiplatelet with Plavix. Stopped  brilinta.If sx like what she had prior to her stent, she will let us know.  No bleeding issues.  LAD stent in 2013.   2. Essential hypertension, benign  Restarted Lisinopril Tablet, 10 MG, half tablet, Orally, Once a day, 90 days, 90, Refills 3  Cough resolved, could switch to ARB if it returns.  She will let us know. lightheadednesss improved with less medicine. BP borderline controlled today. Stopped cardiac rehab.  She walks at least 30 minutes daily.    3. Hypercholesteremia  Continue Simvastatin Tablet, 20 MG, 1 tablet every evening, Orally, Once a day Continuing exercise and dietary changes.  LDL 68 in 2/14; HDL 98 at that time.  Will recheck lipids when fasting.    Preventive Medicine  Adult topics discussed:  Diet: healthy diet.  Exercise: at least 30 minutes of aerobic exercise, 5 days a week.      Signed, Fredric MareJay S. Erroll Wilbourne, MD, [facility] CenterFACC 02/17/2015 4:17 PM

## 2015-02-17 NOTE — Patient Instructions (Signed)
Medication Instructions:  1. STOP ASPRIN 2. CONTINUE THE PLAVIX  Labwork: CMET, FASTING LIPID PANEL   Testing/Procedures: NONE  Follow-Up: FOLLOW UP WITH DR. VARANASI 1 YEAR  Any Other Special Instructions Will Be Listed Below (If Applicable).

## 2015-02-20 ENCOUNTER — Other Ambulatory Visit (INDEPENDENT_AMBULATORY_CARE_PROVIDER_SITE_OTHER): Payer: Commercial Managed Care - PPO | Admitting: *Deleted

## 2015-02-20 DIAGNOSIS — E785 Hyperlipidemia, unspecified: Secondary | ICD-10-CM | POA: Diagnosis not present

## 2015-02-20 LAB — COMPREHENSIVE METABOLIC PANEL
ALK PHOS: 60 U/L (ref 39–117)
ALT: 13 U/L (ref 0–35)
AST: 20 U/L (ref 0–37)
Albumin: 4.1 g/dL (ref 3.5–5.2)
BILIRUBIN TOTAL: 0.5 mg/dL (ref 0.2–1.2)
BUN: 11 mg/dL (ref 6–23)
CO2: 30 mEq/L (ref 19–32)
CREATININE: 0.78 mg/dL (ref 0.40–1.20)
Calcium: 9.8 mg/dL (ref 8.4–10.5)
Chloride: 104 mEq/L (ref 96–112)
GFR: 78.45 mL/min (ref >60.00)
Glucose, Bld: 91 mg/dL (ref 70–99)
Potassium: 4.3 mEq/L (ref 3.5–5.1)
SODIUM: 138 meq/L (ref 135–145)
TOTAL PROTEIN: 7.1 g/dL (ref 6.0–8.3)

## 2015-02-20 LAB — LIPID PANEL
CHOL/HDL RATIO: 2
Cholesterol: 169 mg/dL (ref 0–200)
HDL: 88.7 mg/dL (ref >39.00)
LDL Cholesterol: 61 mg/dL (ref 0–99)
NonHDL: 80.3
TRIGLYCERIDES: 98 mg/dL (ref 0.0–149.0)
VLDL: 19.6 mg/dL (ref 0.0–40.0)

## 2015-03-20 ENCOUNTER — Other Ambulatory Visit: Payer: Self-pay

## 2015-03-20 MED ORDER — SIMVASTATIN 20 MG PO TABS: ORAL_TABLET | ORAL | Status: DC

## 2015-03-20 MED ORDER — CLOPIDOGREL BISULFATE 75 MG PO TABS: ORAL_TABLET | ORAL | Status: DC

## 2015-05-13 ENCOUNTER — Other Ambulatory Visit: Payer: Self-pay | Admitting: *Deleted

## 2015-05-13 MED ORDER — LISINOPRIL 5 MG PO TABS: 5.0000 mg | ORAL_TABLET | Freq: Every day | ORAL | Status: DC

## 2015-06-03 ENCOUNTER — Ambulatory Visit (INDEPENDENT_AMBULATORY_CARE_PROVIDER_SITE_OTHER): Payer: Commercial Managed Care - PPO | Admitting: Podiatry

## 2015-06-03 ENCOUNTER — Encounter: Payer: Self-pay | Admitting: Podiatry

## 2015-06-03 VITALS — BP 147/73 | HR 60 | Resp 12

## 2015-06-03 DIAGNOSIS — M6588 Other synovitis and tenosynovitis, other site: Secondary | ICD-10-CM | POA: Diagnosis not present

## 2015-06-03 DIAGNOSIS — M7751 Other enthesopathy of right foot: Secondary | ICD-10-CM

## 2015-06-03 NOTE — Patient Instructions (Signed)
Continue to walk as long as you do not develop any pain in your right ankle and foot Wear a sturdy athletic style shoe Do not wear flip-flops or go or barefoot

## 2015-06-03 NOTE — Progress Notes (Signed)
   Subjective:    Patient ID: [name], female    DOB: 04/18/1946, 69 y.o.   MRN: [id number]  HPI   This patient presents today complaining of pain in the medial border of the right foot for approximately 2 weeks. She first noticed the symptoms after walking in flip-flops. The pain is activated with standing walking relieved with rest and is improving over time. She denies any direct injury to the foot. She's not had any specific self treatment or professional treatment and/or evaluation. She denies any other bodily complaints at this time  Review of Systems  All other systems reviewed and are negative.      Objective:   Physical Exam  Orientated 3  Vascular: No peripheral edema noted bilaterally DP and PT pulses 2/4 bilaterally Capillary reflex immediate bilaterally  Neurological: Sensation to 10 g monofilament wire intact 5/5 bilaterally Vibratory sensation reactive bilaterally Ankle reflex equal and reactive bilaterally  Dermatological: Texture and turgor within normal limits bilaterally No skin lesions noted bilaterally  Musculoskeletal: Palpable tenderness on the retro-medial malleolar area right extending proximally to the medial navicular area without any palpable lesions. There is no surrounding edema in this area Patient is easily able to heel off bilaterally and unilaterally No too many toe signs bilaterally      Assessment & Plan:   Assessment: Satisfactory neurovascular status Low-grade medial ankle tendinopathy, right  Plan: Reviewed the results examination today and explained patient's problem At this time his symptoms seem to be improving over time I am recommending that she wear a lace up athletic style shoe at all times and monitor her walking program. I advised her that whatever level that she walked at time and or distance if she developed no pain during, after, or the following day to maintain that level of activity. If she developed pain is  result of this activity advised her reduce her walking. Also, advised her that if the pain seemed to be worsening to present for further evaluation

## 2015-06-24 ENCOUNTER — Other Ambulatory Visit: Payer: Self-pay

## 2015-06-24 MED ORDER — CLOPIDOGREL BISULFATE 75 MG PO TABS: ORAL_TABLET | ORAL | Status: DC

## 2015-06-24 MED ORDER — SIMVASTATIN 20 MG PO TABS: ORAL_TABLET | ORAL | Status: DC

## 2015-06-24 MED ORDER — LISINOPRIL 5 MG PO TABS: 5.0000 mg | ORAL_TABLET | Freq: Every day | ORAL | Status: DC

## 2015-06-24 NOTE — Telephone Encounter (Signed)
Fax from [facility] pharmacy with a refill request for Lisinopril 5 mg tab, Simvastatin 20 mg tab, and Clopidogrel 75 mg tab. Each of these medications were filled 03-20-15 and/or 05-23-15, two with a year of refills and one with 5 refills. I called her local pharmacy [facility] on [address] and spoke with Almira Coaster, for her to cancel the refills left for these prescriptions. She asked if she should cancel the two that is ready for her to pick up now? I told her that it was going to a mail order and I am not sure how long it will take to get to her so do not cancel that. She said she will wait until she pick them up and then cancel only the refills.

## 2015-09-07 DIAGNOSIS — Z01419 Encounter for gynecological examination (general) (routine) without abnormal findings: Secondary | ICD-10-CM | POA: Diagnosis not present

## 2015-09-07 DIAGNOSIS — R87610 Atypical squamous cells of undetermined significance on cytologic smear of cervix (ASC-US): Secondary | ICD-10-CM | POA: Diagnosis not present

## 2015-09-07 DIAGNOSIS — R87620 Atypical squamous cells of undetermined significance on cytologic smear of vagina (ASC-US): Secondary | ICD-10-CM | POA: Diagnosis not present

## 2015-11-27 DIAGNOSIS — H2513 Age-related nuclear cataract, bilateral: Secondary | ICD-10-CM | POA: Diagnosis not present

## 2016-01-01 ENCOUNTER — Other Ambulatory Visit: Payer: Self-pay | Admitting: Interventional Cardiology

## 2016-01-04 NOTE — Telephone Encounter (Signed)
Corky Crafts, MD at 02/17/2015 4:16 PM  clopidogrel (PLAVIX) 75 MG tablettake 1 tablet by mouth once daily lisinopril (PRINIVIL,ZESTRIL) 5 MG tabletTake 1 tablet (5 mg total) by mouth daily simvastatin (ZOCOR) 20 MG tablet take 1 tablet by mouth once daily  Coronary atherosclerosis of native coronary artery  Stop Aspir-81 Tablet Delayed Release, 81 MG, 1 tablet, Orally, Once a day Start Clopidogrel Bisulfate Tablet, 75 MG, 1 tablet, Orally, Once a day, 30 day(s), 30, Refills 11 2. Essential hypertension, benign  Restarted Lisinopril Tablet, 10 MG, half tablet, Orally, Once a day, 90 days, 90, Refills 3  3. Hypercholesteremia  Continue Simvastatin Tablet, 20 MG, 1 tablet every evening, Orally, Once a day

## 2016-01-29 DIAGNOSIS — J019 Acute sinusitis, unspecified: Secondary | ICD-10-CM | POA: Diagnosis not present

## 2016-01-29 DIAGNOSIS — H73002 Acute myringitis, left ear: Secondary | ICD-10-CM | POA: Diagnosis not present

## 2016-03-08 NOTE — Progress Notes (Signed)
Patient ID: [name], female   DOB: 06/20/1946, 70 y.o.   MRN: [id number]     Cardiology Office Note   Date:  03/09/2016   ID:  [name], DOB July 04, 1946, MRN [id number]  PCP:  Pearla Dubonnet, MD    No chief complaint on file.  F/u CAD  Wt Readings from Last 3 Encounters:  03/09/16 137 lb (62.143 kg)  02/17/15 138 lb 1.9 oz (62.651 kg)  08/25/14 135 lb (61.236 kg)       History of Present Illness: [name] is a 70 y.o. female  who had a LAD stent in 2013. She has not had any symptoms like what she had before her stent. She exercises 5-6 days a week for 30 minutes a day.  She has not had symptoms like she had prior to her angioplasty. Her lightheadedness persists on the lower dose of lisinopril. She is not checking her blood pressure regularly outside of the doctor's office. Her readings at other doctor's appointments have been better controlled than today.  She needs a refill on NTG.  She watches her grandchildren. No problems with this activity.    Past Medical History  Diagnosis Date  . Hypertension   . Stented coronary artery   . DJD (degenerative joint disease), cervical     followed by Dr. Darlin Coco planned for 2011  . Family history of colon cancer     Past Surgical History  Procedure Laterality Date  . Cardiac catheterization  8/12    sten placement-Dr. Eldridge Dace     Current Outpatient Prescriptions  Medication Sig Dispense Refill  . Cholecalciferol (VITAMIN D3) 1000 UNITS CAPS Take 1,000 Units by mouth daily.     . clopidogrel (PLAVIX) 75 MG tablet TAKE 1 TABLET BY MOUTH ONCE DAILY 30 tablet 1  . estradiol (ESTRACE) 0.1 MG/GM vaginal cream Place 1 Applicatorful vaginally once a week.    Marland Kitchen lisinopril (PRINIVIL,ZESTRIL) 5 MG tablet TAKE 1 TABLET (5 MG TOTAL) BY MOUTH DAILY. 30 tablet 1  . Multiple Vitamin (MULTIVITAMIN) tablet Take 1 tablet by mouth daily.      . nitroGLYCERIN (NITROSTAT) 0.4 MG SL tablet Place 1 tablet (0.4 mg  total) under the tongue every 5 (five) minutes x 3 doses as needed for chest pain. 25 tablet 6  . Omega-3 Fatty Acids (FISH OIL) 1000 MG CAPS Take 1,000 mg by mouth daily.    Marland Kitchen OVER THE COUNTER MEDICATION Take 1 tablet by mouth every other day. Calcium / Magnesium tablet    . simvastatin (ZOCOR) 20 MG tablet TAKE 1 TABLET BY MOUTH ONCE DAILY 30 tablet 1  . triamcinolone cream (KENALOG) 0.1 %   0   No current facility-administered medications for this visit.    Allergies:   Review of patient's allergies indicates no known allergies.    Social History:  The patient  reports that she has never smoked. She does not have any smokeless tobacco history on file. She reports that she drinks about 4.2 oz of alcohol per week. She reports that she does not use illicit drugs.   Family History:  The patient's family history includes CVA in her mother; Congestive Heart Failure in her mother; Other in her father.    ROS:  Please see the history of present illness.   Otherwise, review of systems are positive for lightheadedness-unchanged; no syncope or falling.   All other systems are reviewed and negative.    PHYSICAL EXAM: VS:  BP 144/76 mmHg  Pulse 66  Ht 5' 1.5" (1.562 m)  Wt 137 lb (62.143 kg)  BMI 25.47 kg/m2 , BMI Body mass index is 25.47 kg/(m^2). GEN: Well nourished, well developed, in no acute distress HEENT: normal Neck: no JVD, carotid bruits, or masses Cardiac: RRR; no murmurs, rubs, or gallops,no edema  Respiratory:  clear to auscultation bilaterally, normal work of breathing GI: soft, nontender, nondistended, + BS MS: no deformity or atrophy Skin: warm and dry, no rash Neuro:  Strength and sensation are intact Psych: euthymic mood, full affect   EKG:   The ekg ordered today demonstrates normal   Recent Labs: No results found for requested labs within last 365 days.   Lipid Panel    Component Value Date/Time   CHOL 169 02/20/2015 0736   TRIG 98.0 02/20/2015 0736   HDL  88.70 02/20/2015 0736   CHOLHDL 2 02/20/2015 0736   VLDL 19.6 02/20/2015 0736   LDLCALC 61 02/20/2015 0736     Other studies Reviewed: Additional studies/ records that were reviewed today with results demonstrating: lipids reviewed.   ASSESSMENT AND PLAN:  1. CAD: On clopidogrel. If sx like what she had prior to her stent, she will let us know. No bleeding issues. LAD stent in 2013. 2. HTN: Continue lisinopril.  Had cough but resolved.  If it returns, would consider ARB.  I've asked her to check her blood pressure outside of the doctor's office. 3. Hyperlipidemia: Simvastatin for lipid lowering.  LDL 61 in 4/16.  Will check labs when she is fasting. 4. Screen for hypothyroidism.    Current medicines are reviewed at length with the patient today.  The patient concerns regarding her medicines were addressed.  The following changes have been made:  No change  Labs/ tests ordered today include:   Orders Placed This Encounter  Procedures  . EKG 12-Lead    Recommend 150 minutes/week of aerobic exercise Low fat, low carb, high fiber diet recommended  Disposition:   FU in 1 year   Delorise JacksonSigned, Deliah Strehlow S., MD  03/09/2016 9:11 AM    [facility] [address], [place], [place]  [id number] Phone: [phone]) [phone]; Fax: [phone]

## 2016-03-09 ENCOUNTER — Encounter: Payer: Self-pay | Admitting: Interventional Cardiology

## 2016-03-09 ENCOUNTER — Ambulatory Visit (INDEPENDENT_AMBULATORY_CARE_PROVIDER_SITE_OTHER): Payer: 59 | Admitting: Interventional Cardiology

## 2016-03-09 VITALS — BP 144/76 | HR 66 | Ht 61.5 in | Wt 137.0 lb

## 2016-03-09 DIAGNOSIS — I2510 Atherosclerotic heart disease of native coronary artery without angina pectoris: Secondary | ICD-10-CM | POA: Diagnosis not present

## 2016-03-09 DIAGNOSIS — I10 Essential (primary) hypertension: Secondary | ICD-10-CM

## 2016-03-09 DIAGNOSIS — Z1329 Encounter for screening for other suspected endocrine disorder: Secondary | ICD-10-CM

## 2016-03-09 DIAGNOSIS — E785 Hyperlipidemia, unspecified: Secondary | ICD-10-CM

## 2016-03-09 MED ORDER — NITROGLYCERIN 0.4 MG SL SUBL: 0.4000 mg | SUBLINGUAL_TABLET | SUBLINGUAL | Status: DC | PRN

## 2016-03-09 NOTE — Patient Instructions (Addendum)
**Note De-Identified Jariyah Hackley Obfuscation** Medication Instructions:  Same-no changes  Labwork: CMET, Lipids and TSH on 5/5-please do not eat or drink after midnight the night before labs are drawn.  Testing/Procedures: None  Follow-Up: Your physician wants you to follow-up in: 1 year. You will receive a reminder letter in the mail two months in advance. If you don't receive a letter, please call our office to schedule the follow-up appointment.     If you need a refill on your cardiac medications before your next appointment, please call your pharmacy.

## 2016-03-11 ENCOUNTER — Other Ambulatory Visit: Payer: Self-pay | Admitting: Interventional Cardiology

## 2016-03-11 ENCOUNTER — Other Ambulatory Visit (INDEPENDENT_AMBULATORY_CARE_PROVIDER_SITE_OTHER): Payer: 59 | Admitting: *Deleted

## 2016-03-11 DIAGNOSIS — Z1329 Encounter for screening for other suspected endocrine disorder: Secondary | ICD-10-CM | POA: Diagnosis not present

## 2016-03-11 DIAGNOSIS — E785 Hyperlipidemia, unspecified: Secondary | ICD-10-CM

## 2016-03-11 LAB — COMPREHENSIVE METABOLIC PANEL
ALT: 16 U/L (ref 6–29)
AST: 23 U/L (ref 10–35)
Albumin: 4.2 g/dL (ref 3.6–5.1)
Alkaline Phosphatase: 52 U/L (ref 33–130)
BUN: 12 mg/dL (ref 7–25)
CALCIUM: 9.3 mg/dL (ref 8.6–10.4)
CHLORIDE: 104 mmol/L (ref 98–110)
CO2: 29 mmol/L (ref 20–31)
Creat: 0.78 mg/dL (ref 0.50–0.99)
GLUCOSE: 90 mg/dL (ref 65–99)
POTASSIUM: 4.2 mmol/L (ref 3.5–5.3)
Sodium: 140 mmol/L (ref 135–146)
TOTAL PROTEIN: 6.6 g/dL (ref 6.1–8.1)
Total Bilirubin: 0.6 mg/dL (ref 0.2–1.2)

## 2016-03-11 LAB — LIPID PANEL
CHOL/HDL RATIO: 2.1 ratio (ref <=5.0)
Cholesterol: 165 mg/dL (ref 125–200)
HDL: 78 mg/dL (ref >=46)
LDL CALC: 67 mg/dL (ref <130)
Triglycerides: 98 mg/dL (ref <150)
VLDL: 20 mg/dL (ref <30)

## 2016-03-11 LAB — TSH: TSH: 1.52 m[IU]/L

## 2016-03-12 DIAGNOSIS — Z853 Personal history of malignant neoplasm of breast: Secondary | ICD-10-CM | POA: Diagnosis not present

## 2016-03-12 DIAGNOSIS — Z1231 Encounter for screening mammogram for malignant neoplasm of breast: Secondary | ICD-10-CM | POA: Diagnosis not present

## 2016-04-20 DIAGNOSIS — I2510 Atherosclerotic heart disease of native coronary artery without angina pectoris: Secondary | ICD-10-CM | POA: Diagnosis not present

## 2016-04-20 DIAGNOSIS — E785 Hyperlipidemia, unspecified: Secondary | ICD-10-CM | POA: Diagnosis not present

## 2016-04-20 DIAGNOSIS — I10 Essential (primary) hypertension: Secondary | ICD-10-CM | POA: Diagnosis not present

## 2016-05-18 ENCOUNTER — Other Ambulatory Visit: Payer: Self-pay | Admitting: Interventional Cardiology

## 2016-09-01 DIAGNOSIS — Z0001 Encounter for general adult medical examination with abnormal findings: Secondary | ICD-10-CM | POA: Diagnosis not present

## 2016-09-01 DIAGNOSIS — E785 Hyperlipidemia, unspecified: Secondary | ICD-10-CM | POA: Diagnosis not present

## 2016-09-01 DIAGNOSIS — Z23 Encounter for immunization: Secondary | ICD-10-CM | POA: Diagnosis not present

## 2016-09-01 DIAGNOSIS — E559 Vitamin D deficiency, unspecified: Secondary | ICD-10-CM | POA: Diagnosis not present

## 2016-09-01 DIAGNOSIS — I2510 Atherosclerotic heart disease of native coronary artery without angina pectoris: Secondary | ICD-10-CM | POA: Diagnosis not present

## 2016-09-01 DIAGNOSIS — Z79899 Other long term (current) drug therapy: Secondary | ICD-10-CM | POA: Diagnosis not present

## 2016-09-01 DIAGNOSIS — I10 Essential (primary) hypertension: Secondary | ICD-10-CM | POA: Diagnosis not present

## 2016-09-22 ENCOUNTER — Other Ambulatory Visit: Payer: Self-pay | Admitting: Gastroenterology

## 2016-09-26 ENCOUNTER — Other Ambulatory Visit: Payer: Self-pay | Admitting: Gastroenterology

## 2016-09-28 DIAGNOSIS — Z01419 Encounter for gynecological examination (general) (routine) without abnormal findings: Secondary | ICD-10-CM | POA: Diagnosis not present

## 2016-09-28 DIAGNOSIS — R87610 Atypical squamous cells of undetermined significance on cytologic smear of cervix (ASC-US): Secondary | ICD-10-CM | POA: Diagnosis not present

## 2016-09-28 DIAGNOSIS — Z1151 Encounter for screening for human papillomavirus (HPV): Secondary | ICD-10-CM | POA: Diagnosis not present

## 2016-10-11 DIAGNOSIS — N644 Mastodynia: Secondary | ICD-10-CM | POA: Diagnosis not present

## 2016-11-18 ENCOUNTER — Encounter (HOSPITAL_COMMUNITY): Payer: Self-pay | Admitting: *Deleted

## 2016-11-21 ENCOUNTER — Encounter (HOSPITAL_COMMUNITY): Payer: Self-pay | Admitting: *Deleted

## 2016-11-22 ENCOUNTER — Encounter (HOSPITAL_COMMUNITY): Payer: Self-pay | Admitting: *Deleted

## 2016-11-22 ENCOUNTER — Ambulatory Visit (HOSPITAL_COMMUNITY): Payer: Medicare HMO | Admitting: Anesthesiology

## 2016-11-22 ENCOUNTER — Encounter (HOSPITAL_COMMUNITY)
Admission: RE | Disposition: A | Discharge status: Home / Self Care | Payer: Self-pay | Source: Ambulatory Visit | Attending: Gastroenterology

## 2016-11-22 ENCOUNTER — Ambulatory Visit (HOSPITAL_COMMUNITY)
Admission: RE | Admit: 2016-11-22 | Discharge: 2016-11-22 | Disposition: A | Discharge status: Home / Self Care | Payer: Medicare HMO | Source: Ambulatory Visit | Attending: Gastroenterology | Admitting: Gastroenterology

## 2016-11-22 DIAGNOSIS — I10 Essential (primary) hypertension: Secondary | ICD-10-CM | POA: Diagnosis not present

## 2016-11-22 DIAGNOSIS — Z800 Family history of malignant neoplasm of digestive organs: Secondary | ICD-10-CM | POA: Insufficient documentation

## 2016-11-22 DIAGNOSIS — Z86010 Personal history of colonic polyps: Secondary | ICD-10-CM | POA: Insufficient documentation

## 2016-11-22 DIAGNOSIS — M1990 Unspecified osteoarthritis, unspecified site: Secondary | ICD-10-CM | POA: Insufficient documentation

## 2016-11-22 DIAGNOSIS — Z1211 Encounter for screening for malignant neoplasm of colon: Secondary | ICD-10-CM | POA: Diagnosis not present

## 2016-11-22 DIAGNOSIS — I2510 Atherosclerotic heart disease of native coronary artery without angina pectoris: Secondary | ICD-10-CM | POA: Insufficient documentation

## 2016-11-22 DIAGNOSIS — Z955 Presence of coronary angioplasty implant and graft: Secondary | ICD-10-CM | POA: Insufficient documentation

## 2016-11-22 DIAGNOSIS — R079 Chest pain, unspecified: Secondary | ICD-10-CM | POA: Diagnosis not present

## 2016-11-22 HISTORY — PX: COLONOSCOPY WITH PROPOFOL: SHX5780

## 2016-11-22 SURGERY — COLONOSCOPY WITH PROPOFOL
Anesthesia: Monitor Anesthesia Care

## 2016-11-22 MED ORDER — SODIUM CHLORIDE 0.9 % IV SOLN: INTRAVENOUS | Status: DC

## 2016-11-22 MED ORDER — PHENYLEPHRINE HCL 10 MG/ML IJ SOLN
INTRAMUSCULAR | Status: DC | PRN
  Administered 2016-11-22: 40 ug via INTRAVENOUS

## 2016-11-22 MED ORDER — LACTATED RINGERS IV SOLN
INTRAVENOUS | Status: DC
  Administered 2016-11-22: 1000 mL via INTRAVENOUS
  Administered 2016-11-22: 11:00:00 via INTRAVENOUS

## 2016-11-22 MED ORDER — PROPOFOL 10 MG/ML IV BOLUS
INTRAVENOUS | Status: AC
  Filled 2016-11-22: qty 20

## 2016-11-22 MED ORDER — PROPOFOL 500 MG/50ML IV EMUL
INTRAVENOUS | Status: DC | PRN
  Administered 2016-11-22 (×2): 20 mg via INTRAVENOUS
  Administered 2016-11-22: 50 mg via INTRAVENOUS

## 2016-11-22 MED ORDER — PHENYLEPHRINE 40 MCG/ML (10ML) SYRINGE FOR IV PUSH (FOR BLOOD PRESSURE SUPPORT)
PREFILLED_SYRINGE | INTRAVENOUS | Status: AC
  Filled 2016-11-22: qty 10

## 2016-11-22 MED ORDER — ONDANSETRON HCL 4 MG/2ML IJ SOLN: 4.0000 mg | Freq: Once | INTRAMUSCULAR | Status: DC | PRN

## 2016-11-22 MED ORDER — PROPOFOL 10 MG/ML IV BOLUS
INTRAVENOUS | Status: AC
  Filled 2016-11-22: qty 40

## 2016-11-22 MED ORDER — FENTANYL CITRATE (PF) 100 MCG/2ML IJ SOLN: 25.0000 ug | INTRAMUSCULAR | Status: DC | PRN

## 2016-11-22 MED ORDER — PROPOFOL 500 MG/50ML IV EMUL
INTRAVENOUS | Status: DC | PRN
  Administered 2016-11-22: 150 ug/kg/min via INTRAVENOUS

## 2016-11-22 SURGICAL SUPPLY — 21 items

## 2016-11-22 NOTE — Anesthesia Preprocedure Evaluation (Addendum)
Anesthesia Evaluation  Patient identified by MRN, date of birth, ID band Patient awake    Reviewed: Allergy & Precautions, NPO status , Patient's Chart, lab work & pertinent test results  Airway Mallampati: II  TM Distance: >3 FB Neck ROM: Full    Dental  (+) Teeth Intact, Dental Advisory Given, Implants, Caps,    Pulmonary neg pulmonary ROS,    Pulmonary exam normal breath sounds clear to auscultation       Cardiovascular hypertension, Pt. on medications + CAD and + Cardiac Stents  Normal cardiovascular exam Rhythm:Regular Rate:Normal     Neuro/Psych negative neurological ROS  negative psych ROS   GI/Hepatic negative GI ROS, Neg liver ROS,   Endo/Other  negative endocrine ROS  Renal/GU negative Renal ROS     Musculoskeletal  (+) Arthritis ,   Abdominal   Peds  Hematology negative hematology ROS (+) Blood dyscrasia (Plavix therapy), ,   Anesthesia Other Findings Day of surgery medications reviewed with the patient.  Reproductive/Obstetrics                            Anesthesia Physical Anesthesia Plan  ASA: III  Anesthesia Plan: MAC   Post-op Pain Management:    Induction: Intravenous  Airway Management Planned: Nasal Cannula  Additional Equipment:   Intra-op Plan:   Post-operative Plan:   Informed Consent: I have reviewed the patients History and Physical, chart, labs and discussed the procedure including the risks, benefits and alternatives for the proposed anesthesia with the patient or authorized representative who has indicated his/her understanding and acceptance.   Dental advisory given  Plan Discussed with: CRNA and Anesthesiologist  Anesthesia Plan Comments: (Discussed risks/benefits/alternatives to MAC sedation including need for ventilatory support, hypotension, need for conversion to general anesthesia.  All patient questions answered.  Patient/guardian wishes to  proceed.)        Anesthesia Quick Evaluation

## 2016-11-22 NOTE — Transfer of Care (Signed)
Immediate Anesthesia Transfer of Care Note  Patient: [name]  Procedure(s) Performed: Procedure(s): COLONOSCOPY WITH PROPOFOL (N/A)  Patient Location: PACU  Anesthesia Type:MAC  Level of Consciousness:  sedated, patient cooperative and responds to stimulation  Airway & Oxygen Therapy:Patient Spontanous Breathing and Patient connected to face mask oxgen  Post-op Assessment:  Report given to PACU RN and Post -op Vital signs reviewed and stable  Post vital signs:  Reviewed and stable  Last Vitals:  Vitals:   11/22/16 1019  BP: (!) 173/71  Pulse: 79  Resp: 19  Temp: 35.3 C    Complications: No apparent anesthesia complications

## 2016-11-22 NOTE — Op Note (Signed)
[facility] Patient Name: [name] Procedure Date: 11/22/2016 MRN: [id number] Attending MD: Charolett Bumpers , MD Date of Birth: 02-28-1946 CSN: [id number] Age: 71 Admit Type: Outpatient Procedure:                Colonoscopy Indications:              High risk colon cancer surveillance: Personal                            history of non-advanced adenoma Providers:                Charolett Bumpers, MD, Dwain Sarna, RN, Harrington Challenger, Technician Referring MD:              Medicines:                Propofol per Anesthesia Complications:            No immediate complications. Estimated Blood Loss:     Estimated blood loss: none. Procedure:                Pre-Anesthesia Assessment:                           - Prior to the procedure, a History and Physical                            was performed, and patient medications and                            allergies were reviewed. The patient's tolerance of                            previous anesthesia was also reviewed. The risks                            and benefits of the procedure and the sedation                            options and risks were discussed with the patient.                            All questions were answered, and informed consent                            was obtained. Prior Anticoagulants: The patient has                            taken Plavix (clopidogrel), last dose was 7 days                            prior to procedure. ASA Grade Assessment: II - A  patient with mild systemic disease. After reviewing                            the risks and benefits, the patient was deemed in                            satisfactory condition to undergo the procedure.                           After obtaining informed consent, the colonoscope                            was passed under direct vision. Throughout the                            procedure, the  patient's blood pressure, pulse, and                            oxygen saturations were monitored continuously. The                            EC-[id number] ([id number]) scope was introduced through                            the anus and advanced to the the cecum, identified                            by appendiceal orifice and ileocecal valve. The                            colonoscopy was technically difficult and complex                            due to significant looping. The patient tolerated                            the procedure well. The quality of the bowel                            preparation was good. The terminal ileum, the                            ileocecal valve, the appendiceal orifice and the                            rectum were photographed. Scope In: 10:59:20 AM Scope Out: 11:27:44 AM Scope Withdrawal Time: 0 hours 10 minutes 8 seconds  Total Procedure Duration: 0 hours 28 minutes 24 seconds  Findings:      The perianal and digital rectal examinations were normal.      The entire examined colon appeared normal. Impression:               - The entire examined colon is normal.                           -  No specimens collected. Moderate Sedation:      N/A- Per Anesthesia Care Recommendation:           - Patient has a contact number available for                            emergencies. The signs and symptoms of potential                            delayed complications were discussed with the                            patient. Return to normal activities tomorrow.                            Written discharge instructions were provided to the                            patient.                           - Repeat colonoscopy in 5 years for surveillance.                           - Resume previous diet.                           - Continue present medications. Procedure Code(s):        --- Professional ---                           A2130, Colorectal cancer screening;  colonoscopy on                            individual at high risk Diagnosis Code(s):        --- Professional ---                           Z86.010, Personal history of colonic polyps CPT copyright 2016 American Medical Association. All rights reserved. The codes documented in this report are preliminary and upon coder review may  be revised to meet current compliance requirements. [name], MD Charolett Bumpers, MD 11/22/2016 11:30:33 AM This report has been signed electronically. Number of Addenda: 0

## 2016-11-22 NOTE — Anesthesia Postprocedure Evaluation (Signed)
Anesthesia Post Note  Patient: [name]  Procedure(s) Performed: Procedure(s) (LRB): COLONOSCOPY WITH PROPOFOL (N/A)  Patient location during evaluation: Endoscopy Anesthesia Type: MAC Level of consciousness: awake and alert Pain management: pain level controlled Vital Signs Assessment: post-procedure vital signs reviewed and stable Respiratory status: spontaneous breathing, nonlabored ventilation, respiratory function stable and patient connected to nasal cannula oxygen Cardiovascular status: stable and blood pressure returned to baseline Anesthetic complications: no       Last Vitals:  Vitals:   11/22/16 1140 11/22/16 1145  BP: 137/62 (!) 144/60  Pulse: 70 63  Resp: 14 13  Temp:      Last Pain:  Vitals:   11/22/16 1131  TempSrc: Oral                 Catalina Gravel

## 2016-11-22 NOTE — Discharge Instructions (Signed)

## 2016-11-22 NOTE — H&P (Signed)
Procedure: Surveillance colonoscopy. 70/23/2012 colonoscopy was performed with removal of a small adenomatous colon polyp. Mother was diagnosed with colon cancer after age 69  History: The patient is a 71 year old female born Aug 31, 1946. She is scheduled to undergo a surveillance colonoscopy today. In August 2012, she underwent cardiac catheterization with placement of a cardiac stent. She stopped taking Plavix 7 days prior to her scheduled colonoscopy today.  Past medical history: Hypertension. Cervical spine fusion surgery. Left anterior descending coronary artery stent placed in August 2012.  Family history: Mother was diagnosed with colon cancer after age 69  Exam: The patient is alert and lying comfortably on the endoscopy stretcher. Abdomen is soft and nontender to palpation. Lungs to auscultation. Cardiac exam reveals a regular rhythm.  Plan: Proceed with surveillance colonoscopy

## 2016-11-23 ENCOUNTER — Encounter (HOSPITAL_COMMUNITY): Payer: Self-pay | Admitting: Gastroenterology

## 2017-03-09 NOTE — Progress Notes (Signed)
Patient ID: [name], female   DOB: Aug 08, 1946, 71 y.o.   MRN: [id number]     Cardiology Office Note   Date:  03/10/2017   ID:  [name], DOB Aug 08, 1946, MRN [id number]  PCP:  Pearla DubonnetGATES,ROBERT NEVILL, MD    No chief complaint on file.  F/u CAD  Wt Readings from Last 3 Encounters:  03/10/17 138 lb (62.6 kg)  11/22/16 135 lb (61.2 kg)  03/09/16 137 lb (62.1 kg)       History of Present Illness: [name] is a 71 y.o. female  who had a LAD stent in 2013. She has not had any symptoms like what she had before her stent. She exercises 5-6 days a week for 30 minutes a day.  She has not had symptoms like she had prior to her angioplasty. Her lightheadedness persists on the lower dose of lisinopril. She is not checking her blood pressure regularly outside of the doctor's office. Her readings at other doctor's appointments have been better controlled than today.  She needs a refill on NTG.  She watches her grandchildren. No problems with this activity.  She was relatively sedentary during the winter. We talked about trying to maintain activity indoors while the weather is bad. She plans on getting back to walking. She is careful about her diet. She tries to avoid red meat.    Past Medical History:  Diagnosis Date  . DJD (degenerative joint disease), cervical    followed by Dr. Darlin CocoJoseph Stern-surgery planned for 2011  . Family history of colon cancer   . Hypertension   . Stented coronary artery     Past Surgical History:  Procedure Laterality Date  . CARDIAC CATHETERIZATION  8/12   sten placement-Dr. Eldridge DaceVaranasi  . COLONOSCOPY WITH PROPOFOL N/A 11/22/2016   Procedure: COLONOSCOPY WITH PROPOFOL;  Surgeon: Charolett BumpersMartin K Johnson, MD;  Location: [facility];  Service: Endoscopy;  Laterality: N/A;     Current Outpatient Prescriptions  Medication Sig Dispense Refill  . acetaminophen (TYLENOL) 325 MG tablet Take 325-650 mg by mouth every 6 (six) hours as needed (for pain).    .  Calcium-Magnesium 200-50 MG TABS Take 1 tablet by mouth 3 (three) times a week.    . Cholecalciferol (VITAMIN D3) 1000 UNITS CAPS Take 1,000 Units by mouth daily.     . clopidogrel (PLAVIX) 75 MG tablet Take 1 tablet (75 mg total) by mouth daily. (Patient taking differently: Take 75 mg by mouth at bedtime. ) 90 tablet 2  . estradiol (ESTRACE) 0.1 MG/GM vaginal cream Place 1 Applicatorful vaginally daily as needed (for vaginal dryness).     Marland Kitchen. lisinopril (PRINIVIL,ZESTRIL) 5 MG tablet Take 1 tablet (5 mg total) by mouth daily. 90 tablet 2  . Multiple Vitamin (MULTIVITAMIN) tablet Take 1 tablet by mouth at bedtime.     . nitroGLYCERIN (NITROSTAT) 0.4 MG SL tablet Place 1 tablet (0.4 mg total) under the tongue every 5 (five) minutes x 3 doses as needed for chest pain. 25 tablet 6  . Omega-3 Fatty Acids (FISH OIL) 1200 MG CAPS Take 1,200 mg by mouth daily.    . simvastatin (ZOCOR) 20 MG tablet Take 1 tablet (20 mg total) by mouth daily. (Patient taking differently: Take 20 mg by mouth at bedtime. ) 90 tablet 2  . triamcinolone cream (KENALOG) 0.1 % Apply 1 application topically 2 (two) times daily as needed (for poison ivy exposure.).   0   No current facility-administered medications for this visit.  Allergies:   Patient has no known allergies.    Social History:  The patient  reports that she has never smoked. She has never used smokeless tobacco. She reports that she drinks about 4.2 oz of alcohol per week . She reports that she does not use drugs.   Family History:  The patient's family history includes CVA in her mother; Congestive Heart Failure in her mother; Other in her father.    ROS:  Please see the history of present illness.   Otherwise, review of systems are positive for lightheadedness-unchanged; no syncope or falling.   All other systems are reviewed and negative.    PHYSICAL EXAM: VS:  BP 122/66   Pulse 67   Ht 5' 1.5" (1.562 m)   Wt 138 lb (62.6 kg)   SpO2 97%   BMI  25.65 kg/m  , BMI Body mass index is 25.65 kg/m. GEN: Well nourished, well developed, in no acute distress  HEENT: normal  Neck: no JVD, carotid bruits, or masses Cardiac: RRR; no murmurs, rubs, or gallops,no edema  Respiratory:  clear to auscultation bilaterally, normal work of breathing GI: soft, nontender, nondistended, + BS MS: no deformity or atrophy  Skin: warm and dry, no rash Neuro:  Strength and sensation are intact Psych: euthymic mood, full affect   EKG:   The ekg ordered today demonstrates normalsinus rhythm, no ST changes   Recent Labs: 03/11/2016: ALT 16; BUN 12; Creat 0.78; Potassium 4.2; Sodium 140; TSH 1.52   Lipid Panel    Component Value Date/Time   CHOL 165 03/11/2016 0734   TRIG 98 03/11/2016 0734   HDL 78 03/11/2016 0734   CHOLHDL 2.1 03/11/2016 0734   VLDL 20 03/11/2016 0734   LDLCALC 67 03/11/2016 0734     Other studies Reviewed: Additional studies/ records that were reviewed today with results demonstrating: lipids reviewed from 2017.   ASSESSMENT AND PLAN:  1. CAD: On clopidogrel. If sx like what she had prior to her stent, she will let us know. No bleeding issues. LAD stent in 2013. No angina on current medical therapy. 2. HTN: Continue lisinopril.  Had cough but resolved.  If it returns, would consider ARB. BP has been controlled at other MD visits. 3. Hyperlipidemia: Simvastatin for lipid lowering.  LDL 61 in 4/16.  She will have labs with Dr. Kevan Ny. WIll have her get cholesterol checked at that time.  LDL target < 100.  4. Lightheadedness: Mild symptoms when she stands up quickly. Better if she remembers to change positions slowly. I explained her that this may get worse with age and is probably related to age and her blood pressure medicines. She will be more cognizant of avoiding quick changes in position.    Current medicines are reviewed at length with the patient today.  The patient concerns regarding her medicines were  addressed.  The following changes have been made:  No change  Labs/ tests ordered today include:   No orders of the defined types were placed in this encounter.   Recommend 150 minutes/week of aerobic exercise Low fat, low carb, high fiber diet recommended  Disposition:   FU in 1 year   Signed, Lance Muss, MD  03/10/2017 8:56 AM    [facility] [address] [place], [place], [place]  [address] Phone: [phone]; Fax: [phone]

## 2017-03-10 ENCOUNTER — Encounter: Payer: Self-pay | Admitting: Interventional Cardiology

## 2017-03-10 ENCOUNTER — Ambulatory Visit (INDEPENDENT_AMBULATORY_CARE_PROVIDER_SITE_OTHER): Payer: Medicare HMO | Admitting: Interventional Cardiology

## 2017-03-10 VITALS — BP 122/66 | HR 67 | Ht 61.5 in | Wt 138.0 lb

## 2017-03-10 DIAGNOSIS — I25118 Atherosclerotic heart disease of native coronary artery with other forms of angina pectoris: Secondary | ICD-10-CM | POA: Diagnosis not present

## 2017-03-10 DIAGNOSIS — E785 Hyperlipidemia, unspecified: Secondary | ICD-10-CM | POA: Diagnosis not present

## 2017-03-10 DIAGNOSIS — I10 Essential (primary) hypertension: Secondary | ICD-10-CM

## 2017-03-10 MED ORDER — NITROGLYCERIN 0.4 MG SL SUBL: 0.4000 mg | SUBLINGUAL_TABLET | SUBLINGUAL | 6 refills | Status: DC | PRN

## 2017-03-10 NOTE — Patient Instructions (Signed)

## 2017-03-30 ENCOUNTER — Other Ambulatory Visit: Payer: Self-pay | Admitting: Interventional Cardiology

## 2017-08-10 DIAGNOSIS — Z23 Encounter for immunization: Secondary | ICD-10-CM | POA: Diagnosis not present

## 2017-10-04 DIAGNOSIS — Z01419 Encounter for gynecological examination (general) (routine) without abnormal findings: Secondary | ICD-10-CM | POA: Diagnosis not present

## 2017-10-18 DIAGNOSIS — J329 Chronic sinusitis, unspecified: Secondary | ICD-10-CM | POA: Diagnosis not present

## 2017-10-19 DIAGNOSIS — Z1231 Encounter for screening mammogram for malignant neoplasm of breast: Secondary | ICD-10-CM | POA: Diagnosis not present

## 2017-12-04 DIAGNOSIS — H2513 Age-related nuclear cataract, bilateral: Secondary | ICD-10-CM | POA: Diagnosis not present

## 2018-01-03 DIAGNOSIS — R3129 Other microscopic hematuria: Secondary | ICD-10-CM | POA: Diagnosis not present

## 2018-01-03 DIAGNOSIS — E785 Hyperlipidemia, unspecified: Secondary | ICD-10-CM | POA: Diagnosis not present

## 2018-01-03 DIAGNOSIS — Z0000 Encounter for general adult medical examination without abnormal findings: Secondary | ICD-10-CM | POA: Diagnosis not present

## 2018-01-03 DIAGNOSIS — I2510 Atherosclerotic heart disease of native coronary artery without angina pectoris: Secondary | ICD-10-CM | POA: Diagnosis not present

## 2018-01-03 DIAGNOSIS — E559 Vitamin D deficiency, unspecified: Secondary | ICD-10-CM | POA: Diagnosis not present

## 2018-01-03 DIAGNOSIS — Z79899 Other long term (current) drug therapy: Secondary | ICD-10-CM | POA: Diagnosis not present

## 2018-01-03 DIAGNOSIS — I10 Essential (primary) hypertension: Secondary | ICD-10-CM | POA: Diagnosis not present

## 2018-03-13 ENCOUNTER — Other Ambulatory Visit: Payer: Self-pay | Admitting: Interventional Cardiology

## 2018-03-20 NOTE — Progress Notes (Signed)
Cardiology Office Note   Date:  03/22/2018   ID:  [name], DOB 13-Nov-1945, MRN [id number]  PCP:  Marden Noble, MD    No chief complaint on file. CAD   Wt Readings from Last 3 Encounters:  03/22/18 139 lb (63 kg)  03/10/17 138 lb (62.6 kg)  11/22/16 135 lb (61.2 kg)       History of Present Illness: [name] is a 72 y.o. female    who had a LAD stent in 2013.  She is a former Barrister's clerk at [employer].    In 2018, she reported Mild orthostatic symptoms when she stood up quickly. It was Better if she remembers to change positions slowly.  This has improved since the lisinopril was decreased.  Denies : Chest pain. Dizziness. Leg edema. Nitroglycerin use. Orthopnea. Palpitations. Paroxysmal nocturnal dyspnea. Shortness of breath. Syncope.   Exercise has dropped off the last few months due to watching her grandchild.  She sticks to a plant based diet.  She does eat some fish.  She limits red meat.  Other BP readings were well controlled.   No bleeding problems.       Past Medical History:  Diagnosis Date  . DJD (degenerative joint disease), cervical    followed by Dr. Darlin Coco planned for 2011  . Family history of colon cancer   . Hypertension   . Stented coronary artery     Past Surgical History:  Procedure Laterality Date  . CARDIAC CATHETERIZATION  8/12   sten placement-Dr. Eldridge Dace  . COLONOSCOPY WITH PROPOFOL N/A 11/22/2016   Procedure: COLONOSCOPY WITH PROPOFOL;  Surgeon: Charolett Bumpers, MD;  Location: [facility];  Service: Endoscopy;  Laterality: N/A;     Current Outpatient Medications  Medication Sig Dispense Refill  . acetaminophen (TYLENOL) 325 MG tablet Take 325-650 mg by mouth every 6 (six) hours as needed (for pain).    . Calcium-Magnesium 200-50 MG TABS Take 1 tablet by mouth 3 (three) times a week.    . Cholecalciferol (VITAMIN D3) 1000 UNITS CAPS Take 1,000 Units by mouth daily.     . clopidogrel  (PLAVIX) 75 MG tablet TAKE 1 TABLET EVERY DAY 90 tablet 3  . lisinopril (PRINIVIL,ZESTRIL) 5 MG tablet TAKE 1 TABLET EVERY DAY 90 tablet 0  . Multiple Vitamin (MULTIVITAMIN) tablet Take 1 tablet by mouth at bedtime.     . nitroGLYCERIN (NITROSTAT) 0.4 MG SL tablet Place 1 tablet (0.4 mg total) under the tongue every 5 (five) minutes x 3 doses as needed for chest pain. 25 tablet 6  . Omega-3 Fatty Acids (FISH OIL) 1200 MG CAPS Take 1,200 mg by mouth daily.    . simvastatin (ZOCOR) 20 MG tablet TAKE 1 TABLET EVERY DAY 90 tablet 0  . triamcinolone cream (KENALOG) 0.1 % Apply 1 application topically 2 (two) times daily as needed (for poison ivy exposure.).   0   No current facility-administered medications for this visit.     Allergies:   Patient has no known allergies.    Social History:  The patient  reports that she has never smoked. She has never used smokeless tobacco. She reports that she drinks about 4.2 oz of alcohol per week. She reports that she does not use drugs.   Family History:  The patient's family history includes CVA in her mother; Congestive Heart Failure in her mother; Other in her father.    ROS:  Please see the history of present illness.  Otherwise, review of systems are positive for rare orthostatic sx.   All other systems are reviewed and negative.    PHYSICAL EXAM: VS:  BP 132/76   Pulse (!) 59   Ht 5' 1.5" (1.562 m)   Wt 139 lb (63 kg)   SpO2 99%   BMI 25.84 kg/m  , BMI Body mass index is 25.84 kg/m. GEN: Well nourished, well developed, in no acute distress  HEENT: normal  Neck: no JVD, carotid bruits, or masses Cardiac: RRR; no murmurs, rubs, or gallops,no edema  Respiratory:  clear to auscultation bilaterally, normal work of breathing GI: soft, nontender, nondistended, + BS MS: no deformity or atrophy  Skin: warm and dry, no rash Neuro:  Strength and sensation are intact Psych: euthymic mood, full affect   EKG:   The ekg ordered today demonstrates  NSR, nonspecific ST segment changes   Recent Labs: No results found for requested labs within last 8760 hours.   Lipid Panel    Component Value Date/Time   CHOL 165 03/11/2016 0734   TRIG 98 03/11/2016 0734   HDL 78 03/11/2016 0734   CHOLHDL 2.1 03/11/2016 0734   VLDL 20 03/11/2016 0734   LDLCALC 67 03/11/2016 0734     Other studies Reviewed: Additional studies/ records that were reviewed today with results demonstrating: LDL 66, HDL 81, triglycerides 126, A1c 5.8 in February 2019.   ASSESSMENT AND PLAN:  1. CAD: No angina.  Continue aggressive secondary prevention.  Continue clopidogrel monotherapy. 2. HTN: Well-controlled on lower dose of lisinopril. 3. Hyperlipidemia: Lipids checked with primary care doctor in February 2019.  Results noted above.  Well-controlled.  Continue current dose of simvastatin. 4. Lightheadedness: Improved on lower dose of lisinopril.  Continue to stay well-hydrated.   Current medicines are reviewed at length with the patient today.  The patient concerns regarding her medicines were addressed.  The following changes have been made:  No change  Labs/ tests ordered today include:  No orders of the defined types were placed in this encounter.   Recommend 150 minutes/week of aerobic exercise Low fat, low carb, high fiber diet recommended  Disposition:   FU in 1 year   Signed, Lance Muss, MD  03/22/2018 8:30 AM    [facility] [address] [place], [place], [place]  [address] Phone: [phone]; Fax: [phone]

## 2018-03-22 ENCOUNTER — Ambulatory Visit: Payer: Medicare HMO | Admitting: Interventional Cardiology

## 2018-03-22 ENCOUNTER — Encounter: Payer: Self-pay | Admitting: Interventional Cardiology

## 2018-03-22 VITALS — BP 132/76 | HR 59 | Ht 61.5 in | Wt 139.0 lb

## 2018-03-22 DIAGNOSIS — I10 Essential (primary) hypertension: Secondary | ICD-10-CM | POA: Diagnosis not present

## 2018-03-22 DIAGNOSIS — I25118 Atherosclerotic heart disease of native coronary artery with other forms of angina pectoris: Secondary | ICD-10-CM | POA: Diagnosis not present

## 2018-03-22 DIAGNOSIS — E785 Hyperlipidemia, unspecified: Secondary | ICD-10-CM | POA: Diagnosis not present

## 2018-03-22 MED ORDER — LISINOPRIL 5 MG PO TABS: 5.0000 mg | ORAL_TABLET | Freq: Every day | ORAL | 3 refills | Status: DC

## 2018-03-22 MED ORDER — NITROGLYCERIN 0.4 MG SL SUBL: 0.4000 mg | SUBLINGUAL_TABLET | SUBLINGUAL | 6 refills | Status: DC | PRN

## 2018-03-22 MED ORDER — CLOPIDOGREL BISULFATE 75 MG PO TABS: 75.0000 mg | ORAL_TABLET | Freq: Every day | ORAL | 3 refills | Status: DC

## 2018-03-22 MED ORDER — SIMVASTATIN 20 MG PO TABS: 20.0000 mg | ORAL_TABLET | Freq: Every day | ORAL | 3 refills | Status: DC

## 2018-03-22 NOTE — Patient Instructions (Signed)

## 2018-08-01 DIAGNOSIS — Z23 Encounter for immunization: Secondary | ICD-10-CM | POA: Diagnosis not present

## 2018-10-09 DIAGNOSIS — Z01419 Encounter for gynecological examination (general) (routine) without abnormal findings: Secondary | ICD-10-CM | POA: Diagnosis not present

## 2018-10-10 DIAGNOSIS — R3915 Urgency of urination: Secondary | ICD-10-CM | POA: Diagnosis not present

## 2018-10-22 DIAGNOSIS — Z803 Family history of malignant neoplasm of breast: Secondary | ICD-10-CM | POA: Diagnosis not present

## 2018-10-22 DIAGNOSIS — Z1231 Encounter for screening mammogram for malignant neoplasm of breast: Secondary | ICD-10-CM | POA: Diagnosis not present

## 2019-03-25 ENCOUNTER — Telehealth: Payer: Self-pay

## 2019-03-25 NOTE — Telephone Encounter (Signed)
Virtual Visit Pre-Appointment Phone Call TELEPHONE CALL NOTE  FELISSA GABBERT has been deemed a candidate for a follow-up tele-health visit to limit community exposure during the Covid-19 pandemic. I spoke with the patient via phone to ensure availability of phone/video source, confirm preferred email & phone number, and discuss instructions and expectations.  I reminded KRISTYE BRUHL to be prepared with any vital sign and/or heart rhythm information that could potentially be obtained via home monitoring, at the time of her visit. I reminded ONICA GERGEL to expect a phone call prior to her visit.   Patient agrees to consent below.  Lattie Haw, RN 03/25/2019 9:54 AM   FULL LENGTH CONSENT FOR TELE-HEALTH VISIT   I hereby voluntarily request, consent and authorize [facility] and its employed or contracted physicians, physician assistants, nurse practitioners or other licensed health care professionals (the Practitioner), to provide me with telemedicine health care services (the "Services") as deemed necessary by the treating Practitioner. I acknowledge and consent to receive the Services by the Practitioner via telemedicine. I understand that the telemedicine visit will involve communicating with the Practitioner through live audiovisual communication technology and the disclosure of certain medical information by electronic transmission. I acknowledge that I have been given the opportunity to request an in-person assessment or other available alternative prior to the telemedicine visit and am voluntarily participating in the telemedicine visit.  I understand that I have the right to withhold or withdraw my consent to the use of telemedicine in the course of my care at any time, without affecting my right to future care or treatment, and that the Practitioner or I may terminate the telemedicine visit at any time. I understand that I have the right to inspect all information obtained  and/or recorded in the course of the telemedicine visit and may receive copies of available information for a reasonable fee.  I understand that some of the potential risks of receiving the Services via telemedicine include:  Marland Kitchen Delay or interruption in medical evaluation due to technological equipment failure or disruption; . Information transmitted may not be sufficient (e.g. poor resolution of images) to allow for appropriate medical decision making by the Practitioner; and/or  . In rare instances, security protocols could fail, causing a breach of personal health information.  Furthermore, I acknowledge that it is my responsibility to provide information about my medical history, conditions and care that is complete and accurate to the best of my ability. I acknowledge that Practitioner's advice, recommendations, and/or decision may be based on factors not within their control, such as incomplete or inaccurate data provided by me or distortions of diagnostic images or specimens that may result from electronic transmissions. I understand that the practice of medicine is not an exact science and that Practitioner makes no warranties or guarantees regarding treatment outcomes. I acknowledge that I will receive a copy of this consent concurrently upon execution via email to the email address I last provided but may also request a printed copy by calling the office of [facility].    I understand that my insurance will be billed for this visit.   I have read or had this consent read to me. . I understand the contents of this consent, which adequately explains the benefits and risks of the Services being provided via telemedicine.  . I have been provided ample opportunity to ask questions regarding this consent and the Services and have had my questions answered to my satisfaction. Marland Kitchen I  give my informed consent for the services to be provided through the use of telemedicine in my medical care  By  participating in this telemedicine visit I agree to the above.

## 2019-03-27 ENCOUNTER — Telehealth (INDEPENDENT_AMBULATORY_CARE_PROVIDER_SITE_OTHER): Payer: Self-pay | Admitting: Physician Assistant

## 2019-03-27 ENCOUNTER — Other Ambulatory Visit: Payer: Self-pay

## 2019-03-27 ENCOUNTER — Encounter: Payer: Self-pay | Admitting: Physician Assistant

## 2019-03-27 VITALS — Ht 61.5 in | Wt 142.0 lb

## 2019-03-27 DIAGNOSIS — E785 Hyperlipidemia, unspecified: Secondary | ICD-10-CM

## 2019-03-27 DIAGNOSIS — I10 Essential (primary) hypertension: Secondary | ICD-10-CM

## 2019-03-27 DIAGNOSIS — I25810 Atherosclerosis of coronary artery bypass graft(s) without angina pectoris: Secondary | ICD-10-CM

## 2019-03-27 MED ORDER — SIMVASTATIN 20 MG PO TABS: 20.0000 mg | ORAL_TABLET | Freq: Every day | ORAL | 3 refills | Status: DC

## 2019-03-27 MED ORDER — NITROGLYCERIN 0.4 MG SL SUBL: 0.4000 mg | SUBLINGUAL_TABLET | SUBLINGUAL | 6 refills | Status: DC | PRN

## 2019-03-27 MED ORDER — LISINOPRIL 5 MG PO TABS: 5.0000 mg | ORAL_TABLET | Freq: Every day | ORAL | 3 refills | Status: DC

## 2019-03-27 MED ORDER — CLOPIDOGREL BISULFATE 75 MG PO TABS: 75.0000 mg | ORAL_TABLET | Freq: Every day | ORAL | 3 refills | Status: DC

## 2019-03-27 NOTE — Progress Notes (Signed)
Virtual Visit via Video Note   This visit type was conducted due to national recommendations for restrictions regarding the COVID-19 Pandemic (e.g. social distancing) in an effort to limit this patient's exposure and mitigate transmission in our community.  Due to her co-morbid illnesses, this patient is at least at moderate risk for complications without adequate follow up.  This format is felt to be most appropriate for this patient at this time.  All issues noted in this document were discussed and addressed.  A limited physical exam was performed with this format.  Please refer to the patient's chart for her consent to telehealth for [facility].   Date:  03/27/2019   ID:  [name], DOB 09-04-1946, MRN [id number]  Patient Location: Home Provider Location: Home  PCP:  Marden NobleGates, Robert, MD  Cardiologist:  Lance MussJayadeep Varanasi, MD   Electrophysiologist:  None   Evaluation Performed:  Follow-Up Visit  Chief Complaint:  Yearly f/u  History of Present Illness:    Kristin FamJanet K Lindsey is a 73 y.o. female with history of CAD s/P stent LAD 2013, orthostatic 2018 improved with reducing lisinopril, HTN, HLD,  Patient doing well. Watching her grandchildren. Walking 30-60 min daily. Had a trace of blood in her urine which turned out a normal urological w/u. When having a CT a few weeks ago she felt tight in her chest and she felt panic and nervous and it went away quickly. Never had it happen before.. No shortness of breath. No chest tightness with exertion and has never used NTG. Had check up and labs with PCP in march and all was normal.  The patient does not have symptoms concerning for COVID-19 infection (fever, chills, cough, or new shortness of breath).    Past Medical History:  Diagnosis Date  . DJD (degenerative joint disease), cervical    followed by Dr. Darlin CocoJoseph Stern-surgery planned for 2011  . Family history of colon cancer   . Hypertension   . Stented coronary artery    Past  Surgical History:  Procedure Laterality Date  . CARDIAC CATHETERIZATION  8/12   sten placement-Dr. Eldridge DaceVaranasi  . COLONOSCOPY WITH PROPOFOL N/A 11/22/2016   Procedure: COLONOSCOPY WITH PROPOFOL;  Surgeon: Charolett BumpersMartin K Johnson, MD;  Location: [facility];  Service: Endoscopy;  Laterality: N/A;     Current Meds  Medication Sig  . acetaminophen (TYLENOL) 325 MG tablet Take 325-650 mg by mouth every 6 (six) hours as needed (for pain).  . Calcium-Magnesium 200-50 MG TABS Take 1 tablet by mouth 3 (three) times a week.  . Cholecalciferol (VITAMIN D3) 1000 UNITS CAPS Take 1,000 Units by mouth daily.   . clopidogrel (PLAVIX) 75 MG tablet Take 1 tablet (75 mg total) by mouth daily.  Marland Kitchen. lisinopril (PRINIVIL,ZESTRIL) 5 MG tablet Take 1 tablet (5 mg total) by mouth daily.  . Multiple Vitamin (MULTIVITAMIN) tablet Take 1 tablet by mouth at bedtime.   . nitroGLYCERIN (NITROSTAT) 0.4 MG SL tablet Place 1 tablet (0.4 mg total) under the tongue every 5 (five) minutes x 3 doses as needed for chest pain.  . Omega-3 Fatty Acids (FISH OIL) 1200 MG CAPS Take 1,200 mg by mouth daily.  . simvastatin (ZOCOR) 20 MG tablet Take 1 tablet (20 mg total) by mouth daily.  Marland Kitchen. triamcinolone cream (KENALOG) 0.1 % Apply 1 application topically 2 (two) times daily as needed (for poison ivy exposure.).   . [DISCONTINUED] nitroGLYCERIN (NITROSTAT) 0.4 MG SL tablet Place 1 tablet (0.4 mg total) under the tongue  every 5 (five) minutes x 3 doses as needed for chest pain.     Allergies:   Patient has no known allergies.   Social History   Tobacco Use  . Smoking status: Never Smoker  . Smokeless tobacco: Never Used  Substance Use Topics  . Alcohol use: Yes    Alcohol/week: 7.0 standard drinks    Types: 7 Glasses of wine per week  . Drug use: No     Family Hx: The patient's family history includes CVA in her mother; Congestive Heart Failure in her mother; Other in her father.  ROS:   Please see the history of present illness.       All other systems reviewed and are negative.   Prior CV studies:   The following studies were reviewed today:     Labs/Other Tests and Data Reviewed:    EKG:  No ECG reviewed.  Recent Labs: No results found for requested labs within last 8760 hours.   Recent Lipid Panel Lab Results  Component Value Date/Time   CHOL 165 03/11/2016 07:34 AM   TRIG 98 03/11/2016 07:34 AM   HDL 78 03/11/2016 07:34 AM   CHOLHDL 2.1 03/11/2016 07:34 AM   LDLCALC 67 03/11/2016 07:34 AM    Wt Readings from Last 3 Encounters:  03/27/19 142 lb (64.4 kg)  03/22/18 139 lb (63 kg)  03/10/17 138 lb (62.6 kg)     Objective:    Vital Signs:  Ht 5' 1.5" (1.562 m)   Wt 142 lb (64.4 kg)   BMI 26.40 kg/m    VITAL SIGNS:  reviewed GEN:  no acute distress RESPIRATORY:  normal respiratory effort, symmetric expansion CARDIOVASCULAR:  no peripheral edema  ASSESSMENT & PLAN:    1. CAD S/P stent LAD 2013-no angina. F/U with Dr. Eldridge Dace in 1 yr. 2. HTN-BP good at Dr. Kevan Ny in March.  3. HLD-LDL 66 12/2017-patient says it was good when she checked in March-will try to get a copy.  COVID-19 Education: The signs and symptoms of COVID-19 were discussed with the patient and how to seek care for testing (follow up with PCP or arrange E-visit).   The importance of social distancing was discussed today.  Time:   Today, I have spent 13 minutes with the patient with telehealth technology discussing the above problems.     Medication Adjustments/Labs and Tests Ordered: Current medicines are reviewed at length with the patient today.  Concerns regarding medicines are outlined above.   Tests Ordered: No orders of the defined types were placed in this encounter.   Medication Changes: Meds ordered this encounter  Medications  . nitroGLYCERIN (NITROSTAT) 0.4 MG SL tablet    Sig: Place 1 tablet (0.4 mg total) under the tongue every 5 (five) minutes x 3 doses as needed for chest pain.    Dispense:  25  tablet    Refill:  6    Disposition:  Follow up in 1 year(s) Dr. Eldridge Dace  Signed, Jacolyn Reedy, PA-C  03/27/2019 2:13 PM    [facility]

## 2019-03-27 NOTE — Patient Instructions (Signed)
Medication Instructions:  Your physician recommends that you continue on your current medications as directed. Please refer to the Current Medication list given to you today. 1. All cardiac medications were sent in today to your mail order.  Labwork: -None  Testing/Procedures: -None  Follow-Up: Your physician wants you to follow-up in: 1 year with Dr. Eldridge Dace.  You will receive a reminder letter in the mail two months in advance. If you don't receive a letter, please call our office to schedule the follow-up appointment.   Any Other Special Instructions Will Be Listed Below (If Applicable).     If you need a refill on your cardiac medications before your next appointment, please call your pharmacy.

## 2019-03-28 ENCOUNTER — Ambulatory Visit: Payer: Medicare HMO | Admitting: Interventional Cardiology

## 2019-12-19 ENCOUNTER — Ambulatory Visit: Payer: Self-pay

## 2020-02-09 ENCOUNTER — Other Ambulatory Visit: Payer: Self-pay | Admitting: Physician Assistant

## 2020-03-26 NOTE — Progress Notes (Signed)
Cardiology Office Note   Date:  03/27/2020   ID:  [name], DOB [birth year], MRN [id number]  PCP:  Marden Noble, MD    No chief complaint on file.  CAD  Wt Readings from Last 3 Encounters:  03/27/20 140 lb (63.5 kg)  03/27/19 142 lb (64.4 kg)  03/22/18 139 lb (63 kg)       History of Present Illness: [name] is a 74 y.o. female  who had a LAD stent in 2013.  It was placed around the time of neck surgery.  She is a former Barrister's clerk at [employer].    In 2018, she reported Mild orthostatic symptoms when she stood up quickly. It was Better if she remembers to change positions slowly.  This has improved since the lisinopril was decreased.  In the past, it was noted that : "Exercise has dropped off the last few months due to watching her grandchild.  She sticks to a plant based diet.  She does eat some fish.  She limits red meat."  She has received her COVID vaccines.   Denies : Chest pain. Dizziness. Leg edema. Nitroglycerin use. Orthopnea. Palpitations. Paroxysmal nocturnal dyspnea. Shortness of breath. Syncope.  Son had COVID but recovered.  She walks and hikes and watches her grandchildren.   Diet is strict.   Past Medical History:  Diagnosis Date  . DJD (degenerative joint disease), cervical    followed by Dr. Darlin Coco planned for 2011  . Family history of colon cancer   . Hypertension   . Stented coronary artery     Past Surgical History:  Procedure Laterality Date  . CARDIAC CATHETERIZATION  8/12   sten placement-Dr. Eldridge Dace  . COLONOSCOPY WITH PROPOFOL N/A 11/22/2016   Procedure: COLONOSCOPY WITH PROPOFOL;  Surgeon: Charolett Bumpers, MD;  Location: [facility];  Service: Endoscopy;  Laterality: N/A;     Current Outpatient Medications  Medication Sig Dispense Refill  . acetaminophen (TYLENOL) 325 MG tablet Take 325-650 mg by mouth every 6 (six) hours as needed (for pain).    . Calcium-Magnesium 200-50 MG TABS  Take 1 tablet by mouth 3 (three) times a week.    . Cholecalciferol (VITAMIN D3) 1000 UNITS CAPS Take 1,000 Units by mouth daily.     . clopidogrel (PLAVIX) 75 MG tablet TAKE 1 TABLET BY MOUTH  DAILY 90 tablet 0  . lisinopril (ZESTRIL) 5 MG tablet TAKE 1 TABLET BY MOUTH  DAILY 90 tablet 0  . Multiple Vitamin (MULTIVITAMIN) tablet Take 1 tablet by mouth at bedtime.     . nitroGLYCERIN (NITROSTAT) 0.4 MG SL tablet Place 1 tablet (0.4 mg total) under the tongue every 5 (five) minutes x 3 doses as needed for chest pain. 25 tablet 6  . Omega-3 Fatty Acids (FISH OIL) 1200 MG CAPS Take 1,200 mg by mouth daily.    . simvastatin (ZOCOR) 20 MG tablet TAKE 1 TABLET BY MOUTH  DAILY 90 tablet 0  . triamcinolone cream (KENALOG) 0.1 % Apply 1 application topically 2 (two) times daily as needed (for poison ivy exposure.).   0   No current facility-administered medications for this visit.    Allergies:   Patient has no known allergies.    Social History:  The patient  reports that she has never smoked. She has never used smokeless tobacco. She reports current alcohol use of about 7.0 standard drinks of alcohol per week. She reports that she does not use drugs.  Family History:  The patient's family history includes CVA in her mother; Congestive Heart Failure in her mother; Other in her father.    ROS:  Please see the history of present illness.   Otherwise, review of systems are positive for occasional neck pain- not related to exertion.   All other systems are reviewed and negative.    PHYSICAL EXAM: VS:  BP (!) 142/78   Pulse 75   Ht 5' 1.5" (1.562 m)   Wt 140 lb (63.5 kg)   SpO2 96%   BMI 26.02 kg/m  , BMI Body mass index is 26.02 kg/m. GEN: Well nourished, well developed, in no acute distress  HEENT: normal  Neck: no JVD, carotid bruits, or masses Cardiac: RRR; no murmurs, rubs, or gallops,no edema  Respiratory:  clear to auscultation bilaterally, normal work of breathing GI: soft,  nontender, nondistended, + BS MS: no deformity or atrophy  Skin: warm and dry, no rash Neuro:  Strength and sensation are intact Psych: euthymic mood, full affect   EKG:   The ekg ordered today demonstrates NSR, no ST changes   Recent Labs: No results found for requested labs within last 8760 hours.   Lipid Panel    Component Value Date/Time   CHOL 165 03/11/2016 0734   TRIG 98 03/11/2016 0734   HDL 78 03/11/2016 0734   CHOLHDL 2.1 03/11/2016 0734   VLDL 20 03/11/2016 0734   LDLCALC 67 03/11/2016 0734     Other studies Reviewed: Additional studies/ records that were reviewed today with results demonstrating: PMD labs reviewed.   ASSESSMENT AND PLAN:  1. CAD: Continue aggressive secondary prevention.  No anginal symptoms.  She still maintains a high level of activity.  She will watch for any symptoms like she had prior to her stent, or any change in her exercise tolerance. 2. HTN: Low salt diet.  Borderline here in the office today but normal at her primary care doctor's office.  We will have her check outside of the doctor's office as well. 3. Hyperlipidemia: Still avoiding red meat.  Her diet is mostly plant-based.  We discussed increasing intensity of her statin due to more recent guidelines.  Given her high HDL and low LDL on current dose, will continue current dose of simvastatin.  We discussed co-Q10.  Only if she was to have muscle pains to I think that would be needed.  We will check with Pharm.D. regarding use of turmeric.  She has some joint issues which may improve with his supplement.   Current medicines are reviewed at length with the patient today.  The patient concerns regarding her medicines were addressed.  The following changes have been made:  No change  Labs/ tests ordered today include:  No orders of the defined types were placed in this encounter.   Recommend 150 minutes/week of aerobic exercise Low fat, low carb, high fiber diet  recommended  Disposition:   FU in 1 year   Signed, Larae Grooms, MD  03/27/2020 8:25 AM    [facility] [address], [place], [place]  [address] Phone: [phone]; Fax: [phone]

## 2020-03-27 ENCOUNTER — Ambulatory Visit: Payer: Medicare Other | Admitting: Interventional Cardiology

## 2020-03-27 ENCOUNTER — Encounter: Payer: Self-pay | Admitting: Interventional Cardiology

## 2020-03-27 ENCOUNTER — Other Ambulatory Visit: Payer: Self-pay

## 2020-03-27 ENCOUNTER — Telehealth: Payer: Self-pay

## 2020-03-27 VITALS — BP 142/78 | HR 75 | Ht 61.5 in | Wt 140.0 lb

## 2020-03-27 DIAGNOSIS — I10 Essential (primary) hypertension: Secondary | ICD-10-CM

## 2020-03-27 DIAGNOSIS — I25810 Atherosclerosis of coronary artery bypass graft(s) without angina pectoris: Secondary | ICD-10-CM

## 2020-03-27 DIAGNOSIS — E785 Hyperlipidemia, unspecified: Secondary | ICD-10-CM

## 2020-03-27 MED ORDER — NITROGLYCERIN 0.4 MG SL SUBL: 0.4000 mg | SUBLINGUAL_TABLET | SUBLINGUAL | 6 refills | Status: DC | PRN

## 2020-03-27 NOTE — Telephone Encounter (Signed)
Called and made patient aware that per PharmD she can take turmeric. Instructed patient to watch for S/Sx of bleeding.

## 2020-03-27 NOTE — Patient Instructions (Signed)
Medication Instructions:  Your physician recommends that you continue on your current medications as directed. Please refer to the Current Medication list given to you today.  *If you need a refill on your cardiac medications before your next appointment, please call your pharmacy*   Lab Work: None ordered  If you have labs (blood work) drawn today and your tests are completely normal, you will receive your results only by: . MyChart Message (if you have MyChart) OR . A paper copy in the mail If you have any lab test that is abnormal or we need to change your treatment, we will call you to review the results.   Testing/Procedures: None ordered   Follow-Up: At [facility], you and your health needs are our priority.  As part of our continuing mission to provide you with exceptional heart care, we have created designated Provider Care Teams.  These Care Teams include your primary Cardiologist (physician) and Advanced Practice Providers (APPs -  Physician Assistants and Nurse Practitioners) who all work together to provide you with the care you need, when you need it.  We recommend signing up for the patient portal called "MyChart".  Sign up information is provided on this After Visit Summary.  MyChart is used to connect with patients for Virtual Visits (Telemedicine).  Patients are able to view lab/test results, encounter notes, upcoming appointments, etc.  Non-urgent messages can be sent to your provider as well.   To learn more about what you can do with MyChart, go to [email or web address].    Your next appointment:   12 month(s)  The format for your next appointment:   In Person  Provider:   You may see Jayadeep Varanasi, MD or one of the following Advanced Practice Providers on your designated Care Team:    Dayna Dunn, PA-C  Michele Lenze, PA-C    Other Instructions  High-Fiber Diet Fiber, also called dietary fiber, is a type of carbohydrate that is found in fruits,  vegetables, whole grains, and beans. A high-fiber diet can have many health benefits. Your health care provider may recommend a high-fiber diet to help:  Prevent constipation. Fiber can make your bowel movements more regular.  Lower your cholesterol.  Relieve the following conditions: ? Swelling of veins in the anus (hemorrhoids). ? Swelling and irritation (inflammation) of specific areas of the digestive tract (uncomplicated diverticulosis). ? A problem of the large intestine (colon) that sometimes causes pain and diarrhea (irritable bowel syndrome, IBS).  Prevent overeating as part of a weight-loss plan.  Prevent heart disease, type 2 diabetes, and certain cancers. What is my plan? The recommended daily fiber intake in grams (g) includes:  38 g for men age 50 or younger.  30 g for men over age 50.  25 g for women age 50 or younger.  21 g for women over age 50. You can get the recommended daily intake of dietary fiber by:  Eating a variety of fruits, vegetables, grains, and beans.  Taking a fiber supplement, if it is not possible to get enough fiber through your diet. What do I need to know about a high-fiber diet?  It is better to get fiber through food sources rather than from fiber supplements. There is not a lot of research about how effective supplements are.  Always check the fiber content on the nutrition facts label of any prepackaged food. Look for foods that contain 5 g of fiber or more per serving.  Talk with a diet and nutrition specialist (  dietitian) if you have questions about specific foods that are recommended or not recommended for your medical condition, especially if those foods are not listed below.  Gradually increase how much fiber you consume. If you increase your intake of dietary fiber too quickly, you may have bloating, cramping, or gas.  Drink plenty of water. Water helps you to digest fiber. What are tips for following this plan?  Eat a wide  variety of high-fiber foods.  Make sure that half of the grains that you eat each day are whole grains.  Eat breads and cereals that are made with whole-grain flour instead of refined flour or white flour.  Eat brown rice, bulgur wheat, or millet instead of white rice.  Start the day with a breakfast that is high in fiber, such as a cereal that contains 5 g of fiber or more per serving.  Use beans in place of meat in soups, salads, and pasta dishes.  Eat high-fiber snacks, such as berries, raw vegetables, nuts, and popcorn.  Choose whole fruits and vegetables instead of processed forms like juice or sauce. What foods can I eat?  Fruits Berries. Pears. Apples. Oranges. Avocado. Prunes and raisins. Dried figs. Vegetables Sweet potatoes. Spinach. Kale. Artichokes. Cabbage. Broccoli. Cauliflower. Green peas. Carrots. Squash. Grains Whole-grain breads. Multigrain cereal. Oats and oatmeal. Brown rice. Barley. Bulgur wheat. Millet. Quinoa. Bran muffins. Popcorn. Rye wafer crackers. Meats and other proteins Navy, kidney, and pinto beans. Soybeans. Split peas. Lentils. Nuts and seeds. Dairy Fiber-fortified yogurt. Beverages Fiber-fortified soy milk. Fiber-fortified orange juice. Other foods Fiber bars. The items listed above may not be a complete list of recommended foods and beverages. Contact a dietitian for more options. What foods are not recommended? Fruits Fruit juice. Cooked, strained fruit. Vegetables Fried potatoes. Canned vegetables. Well-cooked vegetables. Grains White bread. Pasta made with refined flour. White rice. Meats and other proteins Fatty cuts of meat. Fried chicken or fried fish. Dairy Milk. Yogurt. Cream cheese. Sour cream. Fats and oils Butters. Beverages Soft drinks. Other foods Cakes and pastries. The items listed above may not be a complete list of foods and beverages to avoid. Contact a dietitian for more information. Summary  Fiber is a type of  carbohydrate. It is found in fruits, vegetables, whole grains, and beans.  There are many health benefits of eating a high-fiber diet, such as preventing constipation, lowering blood cholesterol, helping with weight loss, and reducing your risk of heart disease, diabetes, and certain cancers.  Gradually increase your intake of fiber. Increasing too fast can result in cramping, bloating, and gas. Drink plenty of water while you increase your fiber.  The best sources of fiber include whole fruits and vegetables, whole grains, nuts, seeds, and beans. This information is not intended to replace advice given to you by your health care provider. Make sure you discuss any questions you have with your health care provider. Document Revised: 08/28/2017 Document Reviewed: 08/28/2017 Elsevier Patient Education  2020 Elsevier Inc.   

## 2020-03-30 NOTE — Progress Notes (Signed)
Patient aware.

## 2020-04-30 ENCOUNTER — Other Ambulatory Visit: Payer: Self-pay | Admitting: Physician Assistant

## 2021-03-02 ENCOUNTER — Telehealth: Payer: Self-pay | Admitting: Interventional Cardiology

## 2021-03-02 MED ORDER — CLOPIDOGREL BISULFATE 75 MG PO TABS: 1.0000 | ORAL_TABLET | Freq: Every day | ORAL | 0 refills | Status: DC

## 2021-03-02 NOTE — Telephone Encounter (Signed)
*  STAT* If patient is at the pharmacy, call can be transferred to refill team.   1. Which medications need to be refilled? (please list name of each medication and dose if known)   clopidogrel (PLAVIX) 75 MG tablet [[id number]]   2. Which pharmacy/location (including street and city if local pharmacy) is medication to be sent to? [facility] - [phone]  3. Do they need a 30 day or 90 day supply? 90  Pt also needs a small supply sent to local pharmacy in the mean time  [id number] [place], [place] - [address] AT [facility] [address] [facility] [address] Phone:  [phone]

## 2021-03-02 NOTE — Telephone Encounter (Signed)
Pt's medication was sent to pt's pharmacy as requested. Confirmation received.  °

## 2021-03-21 NOTE — Progress Notes (Signed)
Cardiology Office Note   Date:  03/22/2021   ID:  [name], DOB 20-Mar-1946, MRN [id number]  PCP:  Marden Noble, MD    No chief complaint on file.  CAD  Wt Readings from Last 3 Encounters:  03/22/21 143 lb 6.4 oz (65 kg)  03/27/20 140 lb (63.5 kg)  03/27/19 142 lb (64.4 kg)       History of Present Illness: [name] is a 75 y.o. female  who had a LAD stent in Feb 26, 2012.  It was placed around the time of neck surgery.    She is a former Barrister's clerk at [employer].   In 02-25-2017, she reportedMildorthostaticsymptoms when she stoodup quickly. It wasBetter if she remembers to change positions slowly.This has improved since the lisinopril was decreased.  In the past, it was noted that : "Exercise has dropped off the last few months due to watching her grandchild. She sticks to a plant based diet. She does eat some fish. She limits red meat."  She has received her COVID vaccines.   She walks and hikes and watches her grandchildren.  She continues to hike without any sx. She has had some rare shortness of breath at times.  Also, her younger brother died of sudden cardiac death in Feb 26, 2020.  This was a source of stress.  Denies : Chest pain. Dizziness. Leg edema. Nitroglycerin use. Orthopnea. Palpitations. Paroxysmal nocturnal dyspnea.  Syncope.   Rare orthostasis as well.  She tries to change positions more gradually now.       Past Medical History:  Diagnosis Date  . DJD (degenerative joint disease), cervical    followed by Dr. Darlin Coco planned for February 25, 2010  . Family history of colon cancer   . Hypertension   . Stented coronary artery     Past Surgical History:  Procedure Laterality Date  . CARDIAC CATHETERIZATION  8/12   sten placement-Dr. Eldridge Dace  . COLONOSCOPY WITH PROPOFOL N/A 11/22/2016   Procedure: COLONOSCOPY WITH PROPOFOL;  Surgeon: Charolett Bumpers, MD;  Location: [facility];  Service: Endoscopy;  Laterality: N/A;      Current Outpatient Medications  Medication Sig Dispense Refill  . acetaminophen (TYLENOL) 325 MG tablet Take 325-650 mg by mouth every 6 (six) hours as needed (for pain).    . Calcium-Magnesium 200-50 MG TABS Take 1 tablet by mouth 3 (three) times a week.    . Cholecalciferol (VITAMIN D3) 1000 UNITS CAPS Take 1,000 Units by mouth daily.    . clopidogrel (PLAVIX) 75 MG tablet Take 1 tablet (75 mg total) by mouth daily. Please keep upcoming appt in May 2022 with Dr. Eldridge Dace before anymore refills. Thank you 90 tablet 0  . lisinopril (ZESTRIL) 5 MG tablet TAKE 1 TABLET BY MOUTH  DAILY 90 tablet 3  . Multiple Vitamin (MULTIVITAMIN) tablet Take 1 tablet by mouth at bedtime.    . nitroGLYCERIN (NITROSTAT) 0.4 MG SL tablet Place 1 tablet (0.4 mg total) under the tongue every 5 (five) minutes x 3 doses as needed for chest pain. 25 tablet 6  . Omega-3 Fatty Acids (FISH OIL) 1200 MG CAPS Take 1,200 mg by mouth daily.    . simvastatin (ZOCOR) 20 MG tablet TAKE 1 TABLET BY MOUTH  DAILY 90 tablet 3  . triamcinolone cream (KENALOG) 0.1 % Apply 1 application topically 2 (two) times daily as needed (for poison ivy exposure.).   0   No current facility-administered medications for this visit.    Allergies:  Patient has no known allergies.    Social History:  The patient  reports that she has never smoked. She has never used smokeless tobacco. She reports current alcohol use of about 7.0 standard drinks of alcohol per week. She reports that she does not use drugs.   Family History:  The patient's family history includes CVA in her mother; Congestive Heart Failure in her mother; Other in her father.    ROS:  Please see the history of present illness.   Otherwise, review of systems are positive for rare shortness of breath in the past- mild.   All other systems are reviewed and negative.    PHYSICAL EXAM: VS:  BP 130/72   Pulse 70   Ht 5' 1.5" (1.562 m)   Wt 143 lb 6.4 oz (65 kg)   SpO2 97%    BMI 26.66 kg/m  , BMI Body mass index is 26.66 kg/m. GEN: Well nourished, well developed, in no acute distress  HEENT: normal  Neck: no JVD, carotid bruits, or masses Cardiac: RRR; no murmurs, rubs, or gallops,no edema  Respiratory:  clear to auscultation bilaterally, normal work of breathing GI: soft, nontender, nondistended, + BS MS: no deformity or atrophy  Skin: warm and dry, no rash Neuro:  Strength and sensation are intact Psych: euthymic mood, full affect   EKG:   The ekg ordered today demonstrates NSR, no ST changes   Recent Labs: No results found for requested labs within last 8760 hours.   Lipid Panel    Component Value Date/Time   CHOL 165 03/11/2016 0734   TRIG 98 03/11/2016 0734   HDL 78 03/11/2016 0734   CHOLHDL 2.1 03/11/2016 0734   VLDL 20 03/11/2016 0734   LDLCALC 67 03/11/2016 0734     Other studies Reviewed: Additional studies/ records that were reviewed today with results demonstrating: Labs reviewed; LDL 78.   ASSESSMENT AND PLAN:  1. CAD: Continue aggressive secondary prevention.  Given that it has been nearly 10 years since her stent, her family history of CAD as well as the mild shortness of breath, we will plan for exercise Myoview. 2. HTN: Low-salt diet.  Continue regular exercise.  The current medical regimen is effective;  continue present plan and medications.  Continue current medications. 3. Hyperlipidemia: Whole food, plant-based diet.  Still avoiding red meat and avoiding processed foods.  Continue statin therapy.  Change to rosuvastatin 20 mg daily to have her on high intensity statins that should get LDL less than 70.    Current medicines are reviewed at length with the patient today.  The patient concerns regarding her medicines were addressed.  The following changes have been made:  No change  Labs/ tests ordered today include:  No orders of the defined types were placed in this encounter.   Recommend 150 minutes/week of aerobic  exercise Low fat, low carb, high fiber diet recommended  Disposition:   FU in 1 year   Signed, Lance Muss, MD  03/22/2021 8:44 AM    [facility] [address] [place], [place], [place]  [address] Phone: [phone]; Fax: [phone]

## 2021-03-22 ENCOUNTER — Ambulatory Visit: Payer: Medicare (Managed Care) | Admitting: Interventional Cardiology

## 2021-03-22 ENCOUNTER — Other Ambulatory Visit: Payer: Self-pay

## 2021-03-22 ENCOUNTER — Encounter: Payer: Self-pay | Admitting: *Deleted

## 2021-03-22 ENCOUNTER — Encounter: Payer: Self-pay | Admitting: Interventional Cardiology

## 2021-03-22 VITALS — BP 130/72 | HR 70 | Ht 61.5 in | Wt 143.4 lb

## 2021-03-22 DIAGNOSIS — R0609 Other forms of dyspnea: Secondary | ICD-10-CM

## 2021-03-22 DIAGNOSIS — I10 Essential (primary) hypertension: Secondary | ICD-10-CM | POA: Diagnosis not present

## 2021-03-22 DIAGNOSIS — I25810 Atherosclerosis of coronary artery bypass graft(s) without angina pectoris: Secondary | ICD-10-CM

## 2021-03-22 DIAGNOSIS — R0600 Dyspnea, unspecified: Secondary | ICD-10-CM

## 2021-03-22 DIAGNOSIS — I25708 Atherosclerosis of coronary artery bypass graft(s), unspecified, with other forms of angina pectoris: Secondary | ICD-10-CM | POA: Diagnosis not present

## 2021-03-22 DIAGNOSIS — E785 Hyperlipidemia, unspecified: Secondary | ICD-10-CM | POA: Diagnosis not present

## 2021-03-22 MED ORDER — ROSUVASTATIN CALCIUM 20 MG PO TABS: 20.0000 mg | ORAL_TABLET | Freq: Every day | ORAL | 3 refills | Status: DC

## 2021-03-22 NOTE — Patient Instructions (Signed)
Medication Instructions:  Your physician has recommended you make the following change in your medication: Stop Simvastatin.   Start Rosuvastatin 20 mg by mouth daily  *If you need a refill on your cardiac medications before your next appointment, please call your pharmacy*   Lab Work: Your physician recommends that you return for lab work in: 3 months.  Lipid and Liver profiles.  This will be fasting.  The lab opens at 7:30 AM  If you have labs (blood work) drawn today and your tests are completely normal, you will receive your results only by: Marland Kitchen MyChart Message (if you have MyChart) OR . A paper copy in the mail If you have any lab test that is abnormal or we need to change your treatment, we will call you to review the results.   Testing/Procedures: Your physician has requested that you have an exercise stress myoview. For further information please visit [email or web address] Please follow instruction sheet, as given.    Follow-Up: At [facility], you and your health needs are our priority.  As part of our continuing mission to provide you with exceptional heart care, we have created designated Provider Care Teams.  These Care Teams include your primary Cardiologist (physician) and Advanced Practice Providers (APPs -  Physician Assistants and Nurse Practitioners) who all work together to provide you with the care you need, when you need it.  We recommend signing up for the patient portal called "MyChart".  Sign up information is provided on this After Visit Summary.  MyChart is used to connect with patients for Virtual Visits (Telemedicine).  Patients are able to view lab/test results, encounter notes, upcoming appointments, etc.  Non-urgent messages can be sent to your provider as well.   To learn more about what you can do with MyChart, go to [email or web address].    Your next appointment:   12 month(s)  The format for your next appointment:   In Person  Provider:   You  may see Lance Muss, MD or one of the following Advanced Practice Providers on your designated Care Team:    Ronie Spies, PA-C  Jacolyn Reedy, PA-C    Other Instructions

## 2021-03-31 ENCOUNTER — Telehealth (HOSPITAL_COMMUNITY): Payer: Self-pay | Admitting: *Deleted

## 2021-03-31 NOTE — Telephone Encounter (Signed)
Patient given detailed instructions per Myocardial Perfusion Study Information Sheet for the test on 03/31/21. Patient notified to arrive 15 minutes early and that it is imperative to arrive on time for appointment to keep from having the test rescheduled.  If you need to cancel or reschedule your appointment, please call the office within 24 hours of your appointment. . Patient verbalized understanding. Kimbery Harwood Jacqueline    

## 2021-04-01 ENCOUNTER — Telehealth: Payer: Self-pay | Admitting: Interventional Cardiology

## 2021-04-01 MED ORDER — NITROGLYCERIN 0.4 MG SL SUBL: 0.4000 mg | SUBLINGUAL_TABLET | SUBLINGUAL | 6 refills | Status: DC | PRN

## 2021-04-01 MED ORDER — LISINOPRIL 5 MG PO TABS: 5.0000 mg | ORAL_TABLET | Freq: Every day | ORAL | 3 refills | Status: DC

## 2021-04-01 NOTE — Telephone Encounter (Signed)
90 day refills for Lisinopril and Nitroglycerin has been sent to [facility], per pt request,

## 2021-04-01 NOTE — Telephone Encounter (Signed)
*  STAT* If patient is at the pharmacy, call can be transferred to refill team.   1. Which medications need to be refilled? (please list name of each medication and dose if known) Lisinopril, Nitroglycerin  2. Which pharmacy/location (including street and city if local pharmacy) is medication to be sent to? [facility]  3. Do they need a 30 day or 90 day supply? 90

## 2021-04-06 ENCOUNTER — Other Ambulatory Visit: Payer: Self-pay

## 2021-04-06 MED ORDER — CLOPIDOGREL BISULFATE 75 MG PO TABS: 1.0000 | ORAL_TABLET | Freq: Every day | ORAL | 3 refills | Status: DC

## 2021-04-07 ENCOUNTER — Ambulatory Visit (HOSPITAL_COMMUNITY): Payer: Medicare (Managed Care) | Attending: Cardiology

## 2021-04-07 ENCOUNTER — Other Ambulatory Visit: Payer: Self-pay

## 2021-04-07 DIAGNOSIS — R0600 Dyspnea, unspecified: Secondary | ICD-10-CM | POA: Diagnosis not present

## 2021-04-07 DIAGNOSIS — R0609 Other forms of dyspnea: Secondary | ICD-10-CM

## 2021-04-07 DIAGNOSIS — I25708 Atherosclerosis of coronary artery bypass graft(s), unspecified, with other forms of angina pectoris: Secondary | ICD-10-CM | POA: Diagnosis not present

## 2021-04-07 LAB — MYOCARDIAL PERFUSION IMAGING
Estimated workload: 8.6 METS
Exercise duration (min): 7 min
Exercise duration (sec): 3 s
LV dias vol: 55 mL (ref 46–106)
LV sys vol: 17 mL
MPHR: 148 {beats}/min
Peak HR: 139 {beats}/min
Percent HR: 93 %
Rest HR: 68 {beats}/min
SDS: 0
SRS: 0
SSS: 0
TID: 1

## 2021-04-07 MED ORDER — TECHNETIUM TC 99M TETROFOSMIN IV KIT
31.4000 | PACK | Freq: Once | INTRAVENOUS | Status: AC | PRN
Start: 2021-04-07 — End: 2021-04-07
  Administered 2021-04-07: 31.4 via INTRAVENOUS
  Filled 2021-04-07: qty 32

## 2021-04-07 MED ORDER — TECHNETIUM TC 99M TETROFOSMIN IV KIT
11.0000 | PACK | Freq: Once | INTRAVENOUS | Status: AC | PRN
  Administered 2021-04-07: 11 via INTRAVENOUS
  Filled 2021-04-07: qty 11

## 2021-07-06 ENCOUNTER — Other Ambulatory Visit: Payer: Self-pay

## 2021-07-06 ENCOUNTER — Other Ambulatory Visit: Payer: Medicare (Managed Care) | Admitting: *Deleted

## 2021-07-06 DIAGNOSIS — I25708 Atherosclerosis of coronary artery bypass graft(s), unspecified, with other forms of angina pectoris: Secondary | ICD-10-CM

## 2021-07-06 DIAGNOSIS — E785 Hyperlipidemia, unspecified: Secondary | ICD-10-CM

## 2021-07-06 LAB — HEPATIC FUNCTION PANEL
ALT: 20 IU/L (ref 0–32)
AST: 25 IU/L (ref 0–40)
Albumin: 4.7 g/dL (ref 3.7–4.7)
Alkaline Phosphatase: 71 IU/L (ref 44–121)
Bilirubin Total: 0.4 mg/dL (ref 0.0–1.2)
Bilirubin, Direct: 0.14 mg/dL (ref 0.00–0.40)
Total Protein: 7 g/dL (ref 6.0–8.5)

## 2021-07-06 LAB — LIPID PANEL
Chol/HDL Ratio: 1.9 ratio (ref 0.0–4.4)
Cholesterol, Total: 144 mg/dL (ref 100–199)
HDL: 74 mg/dL (ref >39)
LDL Chol Calc (NIH): 52 mg/dL (ref 0–99)
Triglycerides: 101 mg/dL (ref 0–149)
VLDL Cholesterol Cal: 18 mg/dL (ref 5–40)

## 2021-11-29 DIAGNOSIS — Z13820 Encounter for screening for osteoporosis: Secondary | ICD-10-CM | POA: Diagnosis not present

## 2021-11-29 DIAGNOSIS — Z1231 Encounter for screening mammogram for malignant neoplasm of breast: Secondary | ICD-10-CM | POA: Diagnosis not present

## 2021-12-16 ENCOUNTER — Other Ambulatory Visit: Payer: Self-pay

## 2021-12-16 MED ORDER — ROSUVASTATIN CALCIUM 20 MG PO TABS: 20.0000 mg | ORAL_TABLET | Freq: Every day | ORAL | 0 refills | Status: DC

## 2021-12-16 MED ORDER — CLOPIDOGREL BISULFATE 75 MG PO TABS: 75.0000 mg | ORAL_TABLET | Freq: Every day | ORAL | 0 refills | Status: DC

## 2021-12-17 ENCOUNTER — Other Ambulatory Visit: Payer: Self-pay | Admitting: *Deleted

## 2021-12-17 MED ORDER — CLOPIDOGREL BISULFATE 75 MG PO TABS: 75.0000 mg | ORAL_TABLET | Freq: Every day | ORAL | 0 refills | Status: DC

## 2021-12-17 MED ORDER — ROSUVASTATIN CALCIUM 20 MG PO TABS: 20.0000 mg | ORAL_TABLET | Freq: Every day | ORAL | 0 refills | Status: DC

## 2021-12-17 MED ORDER — LISINOPRIL 5 MG PO TABS: 5.0000 mg | ORAL_TABLET | Freq: Every day | ORAL | 0 refills | Status: DC

## 2022-01-20 DIAGNOSIS — H00012 Hordeolum externum right lower eyelid: Secondary | ICD-10-CM | POA: Diagnosis not present

## 2022-01-20 DIAGNOSIS — H01002 Unspecified blepharitis right lower eyelid: Secondary | ICD-10-CM | POA: Diagnosis not present

## 2022-01-20 DIAGNOSIS — H10411 Chronic giant papillary conjunctivitis, right eye: Secondary | ICD-10-CM | POA: Diagnosis not present

## 2022-02-08 DIAGNOSIS — I10 Essential (primary) hypertension: Secondary | ICD-10-CM | POA: Diagnosis not present

## 2022-02-08 DIAGNOSIS — R3129 Other microscopic hematuria: Secondary | ICD-10-CM | POA: Diagnosis not present

## 2022-02-08 DIAGNOSIS — Z13820 Encounter for screening for osteoporosis: Secondary | ICD-10-CM | POA: Diagnosis not present

## 2022-02-08 DIAGNOSIS — E785 Hyperlipidemia, unspecified: Secondary | ICD-10-CM | POA: Diagnosis not present

## 2022-02-08 DIAGNOSIS — I2510 Atherosclerotic heart disease of native coronary artery without angina pectoris: Secondary | ICD-10-CM | POA: Diagnosis not present

## 2022-02-08 DIAGNOSIS — R202 Paresthesia of skin: Secondary | ICD-10-CM | POA: Diagnosis not present

## 2022-02-08 DIAGNOSIS — E559 Vitamin D deficiency, unspecified: Secondary | ICD-10-CM | POA: Diagnosis not present

## 2022-02-08 DIAGNOSIS — Z0001 Encounter for general adult medical examination with abnormal findings: Secondary | ICD-10-CM | POA: Diagnosis not present

## 2022-02-08 DIAGNOSIS — Z79899 Other long term (current) drug therapy: Secondary | ICD-10-CM | POA: Diagnosis not present

## 2022-02-21 DIAGNOSIS — H0100A Unspecified blepharitis right eye, upper and lower eyelids: Secondary | ICD-10-CM | POA: Diagnosis not present

## 2022-03-10 DIAGNOSIS — M65331 Trigger finger, right middle finger: Secondary | ICD-10-CM | POA: Diagnosis not present

## 2022-03-10 DIAGNOSIS — G5603 Carpal tunnel syndrome, bilateral upper limbs: Secondary | ICD-10-CM | POA: Diagnosis not present

## 2022-03-15 ENCOUNTER — Other Ambulatory Visit: Payer: Self-pay | Admitting: Interventional Cardiology

## 2022-03-16 NOTE — Progress Notes (Signed)
?  ?Cardiology Office Note ? ? ?Date:  03/17/2022  ? ?ID:  [name], DOB 03-12-1946, MRN [id number] ? ?PCP:  Marden Noble, MD  ? ? ?No chief complaint on file. ? ?CAD ? ?Wt Readings from Last 3 Encounters:  ?03/17/22 142 lb 12.8 oz (64.8 kg)  ?04/07/21 143 lb (64.9 kg)  ?03/22/21 143 lb 6.4 oz (65 kg)  ?  ? ?  ?History of Present Illness: ?[name] is a 76 y.o. female    who had a LAD stent in 03/21/12.  It was placed around the time of neck surgery.   ?  ?She is a former Barrister's clerk at [employer].   ?  ?In 03/21/17, she reported Mild orthostatic symptoms when she stood up quickly. It was Better if she remembers to change positions slowly.  This has improved since the lisinopril was decreased. ?  ?In the past, it was noted that : "Exercise has dropped off the last few months due to watching her grandchild.  She sticks to a plant based diet.  She does eat some fish.  She limits red meat." ?  ?She has received her COVID vaccines.  ?  ?She walks and hikes and watches her grandchildren.  She continues to hike without any sx. She has had some rare shortness of breath at times.  Also, her younger brother died of sudden cardiac death in 21-Mar-2020.  This was a source of stress. ? ?Stress test in 04/2021: ?"The left ventricular ejection fraction is hyperdynamic (>65%). ?Nuclear stress EF: 70%. ?Patient exercised according to BRUCE protocol for 7:37min achieving a work load of 8.6 METs. The resting HR was 67bpm and rose to a max HR of 139bpm (93% of maximal, age predicted HR) ?Blood pressure demonstrated a normal response to exercise. ?There was no ST segment deviation noted during stress. ?No T wave inversion was noted during stress. ?Normal myocardial perfusion with no evidence of ischemia or infarction. ?The study is normal. ?This is a low risk study" ? ?Walking regularly with intermittent jogging.  Looking to get back to the gym.  She hikes a some in the mountains and locally.   ? ?Denies : Chest pain. Dizziness.  Leg edema. Nitroglycerin use. Orthopnea. Palpitations. Paroxysmal nocturnal dyspnea. Shortness of breath. Syncope.   ? ? ?Past Medical History:  ?Diagnosis Date  ? DJD (degenerative joint disease), cervical   ? followed by Dr. Darlin Coco planned for 03/21/2010  ? Family history of colon cancer   ? Hypertension   ? Stented coronary artery   ? ? ?Past Surgical History:  ?Procedure Laterality Date  ? CARDIAC CATHETERIZATION  8/12  ? sten placement-Dr. Eldridge Dace  ? COLONOSCOPY WITH PROPOFOL N/A 11/22/2016  ? Procedure: COLONOSCOPY WITH PROPOFOL;  Surgeon: Charolett Bumpers, MD;  Location: [facility];  Service: Endoscopy;  Laterality: N/A;  ? ? ? ?Current Outpatient Medications  ?Medication Sig Dispense Refill  ? acetaminophen (TYLENOL) 325 MG tablet Take 325-650 mg by mouth every 6 (six) hours as needed (for pain).    ? Calcium-Magnesium 200-50 MG TABS Take 1 tablet by mouth 3 (three) times a week.    ? Cholecalciferol (VITAMIN D3) 1000 UNITS CAPS Take 1,000 Units by mouth daily.    ? clopidogrel (PLAVIX) 75 MG tablet Take 1 tablet (75 mg total) by mouth daily. 90 tablet 0  ? lisinopril (ZESTRIL) 5 MG tablet Take 1 tablet (5 mg total) by mouth daily. 90 tablet 0  ? Multiple Vitamin (MULTIVITAMIN) tablet  Take 1 tablet by mouth at bedtime.    ? nitroGLYCERIN (NITROSTAT) 0.4 MG SL tablet Place 1 tablet (0.4 mg total) under the tongue every 5 (five) minutes x 3 doses as needed for chest pain. 25 tablet 6  ? Omega-3 Fatty Acids (FISH OIL) 1200 MG CAPS Take 1,200 mg by mouth daily.    ? rosuvastatin (CRESTOR) 20 MG tablet Take 1 tablet (20 mg total) by mouth daily. 90 tablet 0  ? triamcinolone cream (KENALOG) 0.1 % Apply 1 application topically 2 (two) times daily as needed (for poison ivy exposure.).   0  ? ?No current facility-administered medications for this visit.  ? ? ?Allergies:   Patient has no known allergies.  ? ? ?Social History:  The patient  reports that she has never smoked. She has never used smokeless  tobacco. She reports current alcohol use of about 7.0 standard drinks per week. She reports that she does not use drugs.  ? ?Family History:  The patient's family history includes CVA in her mother; Congestive Heart Failure in her mother; Other in her father.  ? ? ?ROS:  Please see the history of present illness.   Otherwise, review of systems are positive for less exercise during the winter.   All other systems are reviewed and negative.  ? ? ?PHYSICAL EXAM: ?VS:  BP 138/74   Pulse 70   Ht 5' 1.5" (1.562 m)   Wt 142 lb 12.8 oz (64.8 kg)   SpO2 97%   BMI 26.54 kg/m?  , BMI Body mass index is 26.54 kg/m?. ?GEN: Well nourished, well developed, in no acute distress ?HEENT: normal ?Neck: no JVD, carotid bruits, or masses ?Cardiac: RRR; no murmurs, rubs, or gallops,no edema  ?Respiratory:  clear to auscultation bilaterally, normal work of breathing ?GI: soft, nontender, nondistended, + BS ?MS: no deformity or atrophy ?Skin: warm and dry, no rash ?Neuro:  Strength and sensation are intact ?Psych: euthymic mood, full affect ? ? ?EKG:   ?The ekg ordered today demonstrates NSR, no ST changes ? ? ?Recent Labs: ?07/06/2021: ALT 20  ? ?Lipid Panel ?   ?Component Value Date/Time  ? CHOL 144 07/06/2021 0743  ? TRIG 101 07/06/2021 0743  ? HDL 74 07/06/2021 0743  ? CHOLHDL 1.9 07/06/2021 0743  ? CHOLHDL 2.1 03/11/2016 0734  ? VLDL 20 03/11/2016 0734  ? LDLCALC 52 07/06/2021 0743  ? ?  ?Other studies Reviewed: ?Additional studies/ records that were reviewed today with results demonstrating: labs reviewed. ? ? ?ASSESSMENT AND PLAN: ? ?CAD: s/p LAD stent in 2013.  No angina.  Continue aggressive secondary prevention.  No bleeding issues.  ?HTN: The current medical regimen is effective;  continue present plan and medications.  Check readings at home on occasion.  ?Hyperlipidemia: Eating healthy.  LDL 52.,  Whole food, plant-based diet. ? ? ?Current medicines are reviewed at length with the patient today.  The patient concerns  regarding her medicines were addressed. ? ?The following changes have been made:  No change ? ?Labs/ tests ordered today include:  ?No orders of the defined types were placed in this encounter. ? ? ?Recommend 150 minutes/week of aerobic exercise ?Low fat, low carb, high fiber diet recommended ? ?Disposition:   FU in 1 year ? ? ?Signed, ?Lance Muss, MD  ?03/17/2022 11:21 AM    ?[facility] ?[address] [place], [place], [place]  [address] ?Phone: [phone]; Fax: [phone]  ? ?

## 2022-03-17 ENCOUNTER — Encounter: Payer: Self-pay | Admitting: Interventional Cardiology

## 2022-03-17 ENCOUNTER — Ambulatory Visit: Payer: Medicare HMO | Admitting: Interventional Cardiology

## 2022-03-17 ENCOUNTER — Other Ambulatory Visit: Payer: Self-pay | Admitting: Interventional Cardiology

## 2022-03-17 VITALS — BP 138/74 | HR 70 | Ht 61.5 in | Wt 142.8 lb

## 2022-03-17 DIAGNOSIS — I10 Essential (primary) hypertension: Secondary | ICD-10-CM | POA: Diagnosis not present

## 2022-03-17 DIAGNOSIS — E782 Mixed hyperlipidemia: Secondary | ICD-10-CM | POA: Diagnosis not present

## 2022-03-17 DIAGNOSIS — I25708 Atherosclerosis of coronary artery bypass graft(s), unspecified, with other forms of angina pectoris: Secondary | ICD-10-CM | POA: Diagnosis not present

## 2022-03-17 MED ORDER — ROSUVASTATIN CALCIUM 20 MG PO TABS: 20.0000 mg | ORAL_TABLET | Freq: Every day | ORAL | 3 refills | Status: DC

## 2022-03-17 NOTE — Patient Instructions (Signed)

## 2022-04-13 DIAGNOSIS — G5603 Carpal tunnel syndrome, bilateral upper limbs: Secondary | ICD-10-CM | POA: Diagnosis not present

## 2022-04-18 DIAGNOSIS — G5603 Carpal tunnel syndrome, bilateral upper limbs: Secondary | ICD-10-CM | POA: Diagnosis not present

## 2022-04-18 DIAGNOSIS — M65331 Trigger finger, right middle finger: Secondary | ICD-10-CM | POA: Diagnosis not present

## 2022-04-19 ENCOUNTER — Telehealth: Payer: Self-pay | Admitting: Interventional Cardiology

## 2022-04-19 NOTE — Telephone Encounter (Signed)
Patient called and wanted to let Dr. Irish Lack and nurse know that Dr. Amedeo Plenty from [facility] is sending in a medical clearance via fax this afternoon. Would like for someone to call her to let her know if she is approved to have the procedure.

## 2022-04-20 ENCOUNTER — Telehealth: Payer: Self-pay

## 2022-04-20 ENCOUNTER — Encounter: Payer: Self-pay | Admitting: Interventional Cardiology

## 2022-04-20 ENCOUNTER — Telehealth: Payer: Self-pay | Admitting: *Deleted

## 2022-04-20 NOTE — Telephone Encounter (Signed)
Pt sent a MY CHART message to the office that she is having surgery with Dr. Amedeo Plenty 04/26/22 and she will need to hold her blood thinner as of 04/20/22, see MY CHART message.   I called and left a message for surgery scheduler Hassan Buckler, to please fax over clearance request ASAP as surgery is Tuesday 6/20. Fax to [phone] attn: pre op team. We cannot proceed with the clearance assessment until we receive the request.

## 2022-04-20 NOTE — Telephone Encounter (Signed)
Please notify [facility] and/or patient that no formal request has been received

## 2022-04-20 NOTE — Telephone Encounter (Signed)
OK to hold Plavix 5 days prior to procedure.  JV

## 2022-04-20 NOTE — Telephone Encounter (Signed)
   Primary Cardiologist: Larae Grooms, MD  Chart reviewed as part of pre-operative protocol coverage. Given past medical history and time since last visit, based on ACC/AHA guidelines, [name] would be at acceptable risk for the planned procedure without further cardiovascular testing.   Patient may hold Plavix for 5 days prior to surgery and should resume as soon as hemodynamically stable postoperatively.  I will route this recommendation to the requesting party via Epic fax function and remove from pre-op pool.  Please call with questions. Emmaline Life, NP-C    04/20/2022, 2:00 PM [place] [id number] Office [phone] Fax [phone]

## 2022-04-20 NOTE — Telephone Encounter (Signed)
See phone note started about clearance

## 2022-04-20 NOTE — Telephone Encounter (Signed)
   Pre-operative Risk Assessment    Patient Name: [name]  DOB: 1946/01/05 MRN: [id number]      Request for Surgical Clearance    Procedure:   Right Carpal Tunnel Release. Right Middle Finger A1 Pully/Trigger release.  Date of Surgery:  Clearance 04/26/22                                 Surgeon:  Dr. Dominica Severin Surgeon's Group or Practice Name:  [facility] Phone number:  [phone] Fax number:  [phone] [name]   Type of Clearance Requested:   - Pharmacy:  Hold Clopidogrel (Plavix) 5   Type of Anesthesia:  Local    Additional requests/questions:    Signed, Irena Cords Harlow Basley   04/20/2022, 9:20 AM

## 2022-04-20 NOTE — Telephone Encounter (Signed)
Patient calling in for update. Please advise ?

## 2022-04-20 NOTE — Telephone Encounter (Signed)
Swinyer, Zachary George, NP 45 minutes ago (8:31 AM)   Please notify [facility] and/or patient that no formal request has been received      Note    Pough, Aneta Mins routed conversation to [facility] 16 hours ago (5:09 PM)   Brayton El 16 hours ago (5:09 PM)   PP Patient called and wanted to let Dr. Eldridge Dace and nurse know that Dr. Amanda Pea from [facility] is sending in a medical clearance via fax this afternoon. Would like for someone to call her to let her know if she is approved to have the procedure.      Note

## 2022-04-20 NOTE — Telephone Encounter (Signed)
Clearance request has been entered into epic

## 2022-04-20 NOTE — Telephone Encounter (Signed)
Dr. Eldridge Dace,  Request for patient to hold Plavix for 5 days prior to right CTR and right middle finger trigger release. History of CAD with stent to LAD in 2013 with no concerning symptoms per your last office note on 03/17/22.  Please route your response to p cv div preop.  Thank you, Levi Aland, NP-C    04/20/2022, 12:43 PM [facility] [address], [address] Office [phone] Fax [phone]

## 2022-04-26 DIAGNOSIS — M65331 Trigger finger, right middle finger: Secondary | ICD-10-CM | POA: Diagnosis not present

## 2022-04-26 DIAGNOSIS — G5601 Carpal tunnel syndrome, right upper limb: Secondary | ICD-10-CM | POA: Diagnosis not present

## 2022-04-26 DIAGNOSIS — M65841 Other synovitis and tenosynovitis, right hand: Secondary | ICD-10-CM | POA: Diagnosis not present

## 2022-05-11 DIAGNOSIS — M79641 Pain in right hand: Secondary | ICD-10-CM | POA: Diagnosis not present

## 2022-05-18 ENCOUNTER — Telehealth: Payer: Self-pay | Admitting: *Deleted

## 2022-05-18 DIAGNOSIS — Z7901 Long term (current) use of anticoagulants: Secondary | ICD-10-CM | POA: Diagnosis not present

## 2022-05-18 DIAGNOSIS — Z800 Family history of malignant neoplasm of digestive organs: Secondary | ICD-10-CM | POA: Diagnosis not present

## 2022-05-18 DIAGNOSIS — M79641 Pain in right hand: Secondary | ICD-10-CM | POA: Diagnosis not present

## 2022-05-18 DIAGNOSIS — Z86010 Personal history of colonic polyps: Secondary | ICD-10-CM | POA: Diagnosis not present

## 2022-05-18 NOTE — Telephone Encounter (Signed)
   Pre-operative Risk Assessment    Patient Name: [name]  DOB: 03/14/1946 MRN: [id number]      Request for Surgical Clearance    Procedure:   Colonoscopy/endoscopy  Date of Surgery:  Clearance 07/12/22                                 Surgeon:  Dr. Ewing Schlein Surgeon's Group or Practice Name:  [facility] Phone number:  [phone] Fax number:  [phone]   Type of Clearance Requested:   - Pharmacy:  Hold Clopidogrel (Plavix) 5 days   Type of Anesthesia:  Not Indicated   Additional requests/questions:    Signed, Emmit Pomfret   05/18/2022, 4:16 PM

## 2022-05-24 NOTE — Telephone Encounter (Signed)
   Patient Name: [name]  DOB: [birth year] MRN: [id number]  Primary Cardiologist: Lance Muss, MD  Chart reviewed as part of pre-operative protocol coverage.  The patient was recently given okay to hold Plavix for 5 days prior to right CTR and right middle finger trigger release.   She can hold Plavix for upcoming GI procedure as requested.    I will route this recommendation to the requesting party via Epic fax function and remove from pre-op pool.  Please call with questions.  [place], [place] 05/24/2022, 4:18 PM

## 2022-05-25 DIAGNOSIS — I10 Essential (primary) hypertension: Secondary | ICD-10-CM | POA: Diagnosis not present

## 2022-05-25 DIAGNOSIS — H8111 Benign paroxysmal vertigo, right ear: Secondary | ICD-10-CM | POA: Diagnosis not present

## 2022-05-25 DIAGNOSIS — M79641 Pain in right hand: Secondary | ICD-10-CM | POA: Diagnosis not present

## 2022-05-30 DIAGNOSIS — H10503 Unspecified blepharoconjunctivitis, bilateral: Secondary | ICD-10-CM | POA: Diagnosis not present

## 2022-05-30 DIAGNOSIS — H0100A Unspecified blepharitis right eye, upper and lower eyelids: Secondary | ICD-10-CM | POA: Diagnosis not present

## 2022-06-01 ENCOUNTER — Other Ambulatory Visit: Payer: Self-pay | Admitting: Interventional Cardiology

## 2022-06-21 DIAGNOSIS — I10 Essential (primary) hypertension: Secondary | ICD-10-CM | POA: Diagnosis not present

## 2022-07-07 DIAGNOSIS — I10 Essential (primary) hypertension: Secondary | ICD-10-CM | POA: Diagnosis not present

## 2022-07-12 DIAGNOSIS — Z09 Encounter for follow-up examination after completed treatment for conditions other than malignant neoplasm: Secondary | ICD-10-CM | POA: Diagnosis not present

## 2022-07-12 DIAGNOSIS — K5730 Diverticulosis of large intestine without perforation or abscess without bleeding: Secondary | ICD-10-CM | POA: Diagnosis not present

## 2022-07-12 DIAGNOSIS — K649 Unspecified hemorrhoids: Secondary | ICD-10-CM | POA: Diagnosis not present

## 2022-07-12 DIAGNOSIS — Z86010 Personal history of colonic polyps: Secondary | ICD-10-CM | POA: Diagnosis not present

## 2022-07-12 DIAGNOSIS — Z800 Family history of malignant neoplasm of digestive organs: Secondary | ICD-10-CM | POA: Diagnosis not present

## 2022-07-14 DIAGNOSIS — H5203 Hypermetropia, bilateral: Secondary | ICD-10-CM | POA: Diagnosis not present

## 2022-07-14 DIAGNOSIS — H01004 Unspecified blepharitis left upper eyelid: Secondary | ICD-10-CM | POA: Diagnosis not present

## 2022-07-14 DIAGNOSIS — H01001 Unspecified blepharitis right upper eyelid: Secondary | ICD-10-CM | POA: Diagnosis not present

## 2022-07-14 DIAGNOSIS — H2513 Age-related nuclear cataract, bilateral: Secondary | ICD-10-CM | POA: Diagnosis not present

## 2022-07-28 ENCOUNTER — Telehealth: Payer: Self-pay | Admitting: *Deleted

## 2022-07-28 NOTE — Telephone Encounter (Signed)
   Pre-operative Risk Assessment    Patient Name: [name]  DOB: 10-08-1946 MRN: [id number]      Request for Surgical Clearance    Procedure:   LEFT CARPAL TUNNEL RELEASE  Date of Surgery:  Clearance TBD                                 Surgeon:  DR. Roseanne Kaufman Surgeon's Group or Practice Name:  [facility] Phone number:  [phone] Fax number:  [phone]  [name]   Type of Clearance Requested:   - Pharmacy:  Hold Clopidogrel (Plavix) NOT INDICATED HOW LONG   Type of Anesthesia:  Not Indicated   Additional requests/questions:    [name]   07/28/2022, 10:32 AM

## 2022-08-02 NOTE — Telephone Encounter (Signed)
Pt states she has been waiting for a clearance for this so they can schedule surgery and would like a call back to get an update on this. Please advise.

## 2022-08-02 NOTE — Telephone Encounter (Signed)
   Patient Name: [name]  DOB: February 20, 1946 MRN: [id number]  Primary Cardiologist: Larae Grooms, MD  Chart reviewed as part of pre-operative protocol coverage. Given past medical history and time since last visit, based on ACC/AHA guidelines, ALLEYNE LAC would be at acceptable risk for the planned procedure without further cardiovascular testing.   Recently cleared for right CTR and right middle finger trigger release as well as colonoscopy/endoscopy. Per Dr. Irish Lack may hold Plavis 5 days prior to planned procedure.   The patient was advised that if she develops new symptoms prior to surgery to contact our office to arrange for a follow-up visit, and she verbalized understanding.  I will route this recommendation to the requesting party via Epic fax function and remove from pre-op pool.  Please call with questions.  Loel Dubonnet, NP 08/02/2022, 4:48 PM

## 2022-09-06 DIAGNOSIS — G5602 Carpal tunnel syndrome, left upper limb: Secondary | ICD-10-CM | POA: Diagnosis not present

## 2022-09-23 DIAGNOSIS — M79642 Pain in left hand: Secondary | ICD-10-CM | POA: Diagnosis not present

## 2022-10-04 DIAGNOSIS — I10 Essential (primary) hypertension: Secondary | ICD-10-CM | POA: Diagnosis not present

## 2022-10-12 DIAGNOSIS — M79642 Pain in left hand: Secondary | ICD-10-CM | POA: Diagnosis not present

## 2022-11-18 DIAGNOSIS — Z01419 Encounter for gynecological examination (general) (routine) without abnormal findings: Secondary | ICD-10-CM | POA: Diagnosis not present

## 2022-11-18 DIAGNOSIS — Z6826 Body mass index (BMI) 26.0-26.9, adult: Secondary | ICD-10-CM | POA: Diagnosis not present

## 2022-11-18 DIAGNOSIS — Z01411 Encounter for gynecological examination (general) (routine) with abnormal findings: Secondary | ICD-10-CM | POA: Diagnosis not present

## 2022-11-18 DIAGNOSIS — Z124 Encounter for screening for malignant neoplasm of cervix: Secondary | ICD-10-CM | POA: Diagnosis not present

## 2022-11-23 DIAGNOSIS — J0100 Acute maxillary sinusitis, unspecified: Secondary | ICD-10-CM | POA: Diagnosis not present

## 2022-11-23 DIAGNOSIS — I9589 Other hypotension: Secondary | ICD-10-CM | POA: Diagnosis not present

## 2022-12-04 DIAGNOSIS — N390 Urinary tract infection, site not specified: Secondary | ICD-10-CM | POA: Diagnosis not present

## 2022-12-07 DIAGNOSIS — Z1231 Encounter for screening mammogram for malignant neoplasm of breast: Secondary | ICD-10-CM | POA: Diagnosis not present

## 2023-02-22 ENCOUNTER — Ambulatory Visit: Payer: Medicare HMO | Admitting: Podiatry

## 2023-02-22 ENCOUNTER — Encounter: Payer: Self-pay | Admitting: Podiatry

## 2023-02-22 VITALS — BP 144/62 | HR 72

## 2023-02-22 DIAGNOSIS — B351 Tinea unguium: Secondary | ICD-10-CM | POA: Diagnosis not present

## 2023-02-22 DIAGNOSIS — M79674 Pain in right toe(s): Secondary | ICD-10-CM

## 2023-02-22 DIAGNOSIS — M79675 Pain in left toe(s): Secondary | ICD-10-CM

## 2023-02-22 NOTE — Progress Notes (Addendum)
This patient presents to the office with thick painful big toenails  both feet.  She says she is vacationing in 2 weeks and expects significant walking.  She says she has attempted to trim her nails but the pain in the big toenails persist.  She presents to the office for evaluation and treatment.  Patient takes plavix.  Vascular  Dorsalis pedis and posterior tibial pulses are palpable  B/L.  Capillary return  WNL.  Temperature gradient is  WNL.  Skin turgor  WNL  Sensorium  Senn Weinstein monofilament wire  WNL. Normal tactile sensation.  Nail Exam  Patient has nails with thickness noted hallux toenails.  Significant pincer toenails hallux  B/L.  No evidence of infection.    Orthopedic  Exam  Muscle tone and muscle strength  WNL.  No limitations of motion feet  B/L.  No crepitus or joint effusion noted.  Foot type is unremarkable and digits show no abnormalities.  Bony prominences are unremarkable.  Skin  No open lesions.  Normal skin texture and turgor.   Onychomycosis  Hallux nails  B/L.  IE.  Discussed conservative and surgical options were discussed.  Debrided hallux toenails.     Helane Gunther DPM

## 2023-02-28 ENCOUNTER — Ambulatory Visit: Payer: Commercial Managed Care - PPO | Admitting: Podiatry

## 2023-03-26 NOTE — Progress Notes (Unsigned)
Office Visit    Patient Name: [name] Date of Encounter: 03/26/2023  Primary Care Provider:  Marden Noble, MD Primary Cardiologist:  Lance Muss, MD Primary Electrophysiologist: None   Past Medical History    Past Medical History:  Diagnosis Date   DJD (degenerative joint disease), cervical    followed by Dr. Darlin Coco planned for 2011   Family history of colon cancer    Hypertension    Stented coronary artery    Past Surgical History:  Procedure Laterality Date   CARDIAC CATHETERIZATION  8/12   sten placement-Dr. Eldridge Dace   COLONOSCOPY WITH PROPOFOL N/A 11/22/2016   Procedure: COLONOSCOPY WITH PROPOFOL;  Surgeon: Charolett Bumpers, MD;  Location: [facility];  Service: Endoscopy;  Laterality: N/A;    Allergies  No Known Allergies   History of Present Illness    [name]  is a 77 year old female with a PMH of CAD s/p PCI to 90% stenosed LAD with DES x 1, HTN, HLD, orthostatic hypotension who presents today for 1 year follow-up.  Ms. Buys has been followed by Dr.Varanasi since 2012 when she underwent LHC and was found to have 90% proximal LAD lesion that was repaired with DES x 1.  She had recurrent chest pain in 2014 and underwent outpatient stress test that was normal.  She experienced mild orthostatic symptoms that was due to lisinopril and resolved with adjustment to medication.  She underwent a repeat exercise Myoview in 2022 due to mild shortness of breath that was normal.  Her blood pressures have remained well-controlled since this episode.  She was last seen by Dr. Eldridge Dace on 03/17/2022 and was doing well with no new cardiac complaints.  No medication changes were made at that time as well.  Since last being seen in the office patient reports***.  Patient denies chest pain, palpitations, dyspnea, PND, orthopnea, nausea, vomiting, dizziness, syncope, edema, weight gain, or early satiety.     ***Notes:  Home Medications    Current  Outpatient Medications  Medication Sig Dispense Refill   acetaminophen (TYLENOL) 325 MG tablet Take 325-650 mg by mouth every 6 (six) hours as needed (for pain).     Calcium-Magnesium 200-50 MG TABS Take 1 tablet by mouth 3 (three) times a week.     Cholecalciferol (VITAMIN D3) 1000 UNITS CAPS Take 1,000 Units by mouth daily.     clopidogrel (PLAVIX) 75 MG tablet TAKE 1 TABLET EVERY DAY 90 tablet 3   hydrochlorothiazide (MICROZIDE) 12.5 MG capsule Take 12.5 mg by mouth daily.     lisinopril (ZESTRIL) 5 MG tablet TAKE 1 TABLET EVERY DAY 90 tablet 3   Multiple Vitamin (MULTIVITAMIN) tablet Take 1 tablet by mouth at bedtime.     nitroGLYCERIN (NITROSTAT) 0.4 MG SL tablet Place 1 tablet (0.4 mg total) under the tongue every 5 (five) minutes x 3 doses as needed for chest pain. 25 tablet 6   Omega-3 Fatty Acids (FISH OIL) 1200 MG CAPS Take 1,200 mg by mouth daily.     rosuvastatin (CRESTOR) 20 MG tablet TAKE 1 TABLET EVERY DAY 90 tablet 2   triamcinolone cream (KENALOG) 0.1 % Apply 1 application topically 2 (two) times daily as needed (for poison ivy exposure.).   0   No current facility-administered medications for this visit.     Review of Systems  Please see the history of present illness.    (+)*** (+)***  All other systems reviewed and are otherwise negative except as  noted above.  Physical Exam    Wt Readings from Last 3 Encounters:  03/17/22 142 lb 12.8 oz (64.8 kg)  04/07/21 143 lb (64.9 kg)  03/22/21 143 lb 6.4 oz (65 kg)   ZO:XWRUE were no vitals filed for this visit.,There is no height or weight on file to calculate BMI.  Constitutional:      Appearance: Healthy appearance. Not in distress.  Neck:     Vascular: JVD normal.  Pulmonary:     Effort: Pulmonary effort is normal.     Breath sounds: No wheezing. No rales. Diminished in the bases Cardiovascular:     Normal rate. Regular rhythm. Normal S1. Normal S2.      Murmurs: There is no murmur.  Edema:    Peripheral  edema absent.  Abdominal:     Palpations: Abdomen is soft non tender. There is no hepatomegaly.  Skin:    General: Skin is warm and dry.  Neurological:     General: No focal deficit present.     Mental Status: Alert and oriented to person, place and time.     Cranial Nerves: Cranial nerves are intact.  EKG/LABS/ Recent Cardiac Studies    ECG personally reviewed by me today - ***  Cardiac Studies & Procedures     STRESS TESTS  MYOCARDIAL PERFUSION IMAGING 04/07/2021  Narrative  The left ventricular ejection fraction is hyperdynamic (>65%).  Nuclear stress EF: 70%.  Patient exercised according to BRUCE protocol for 7:73min achieving a work load of 8.6 METs. The resting HR was 67bpm and rose to a max HR of 139bpm (93% of maximal, age predicted HR)  Blood pressure demonstrated a normal response to exercise.  There was no ST segment deviation noted during stress.  No T wave inversion was noted during stress.  Normal myocardial perfusion with no evidence of ischemia or infarction.  The study is normal.  This is a low risk study.  Laurance Flatten, MD              Risk Assessment/Calculations:   {Does this patient have ATRIAL FIBRILLATION?:[id number]}        Lab Results  Component Value Date   WBC 6.2 01/08/2013   HGB 11.7 (L) 01/08/2013   HCT 34.2 (L) 01/08/2013   MCV 89.3 01/08/2013   PLT 268 01/08/2013   Lab Results  Component Value Date   CREATININE 0.78 03/11/2016   BUN 12 03/11/2016   NA 140 03/11/2016   K 4.2 03/11/2016   CL 104 03/11/2016   CO2 29 03/11/2016   Lab Results  Component Value Date   ALT 20 07/06/2021   AST 25 07/06/2021   ALKPHOS 71 07/06/2021   BILITOT 0.4 07/06/2021   Lab Results  Component Value Date   CHOL 144 07/06/2021   HDL 74 07/06/2021   LDLCALC 52 07/06/2021   TRIG 101 07/06/2021   CHOLHDL 1.9 07/06/2021    Lab Results  Component Value Date   HGBA1C 5.8 (H) 01/07/2013     Assessment & Plan    1.  History  of CAD: -s/p LHC in 2012 due to chest pain and found to have 90% stenosed proximal LAD treated with DES x 1.  Her most recent ischemic evaluation was 2022 by Steffanie Dunn that was normal -Today patient reports*** -Continue GDMT with***  2.  Essential hypertension: -Patient's blood pressure today was*** -Continue***  3.  Hyperlipidemia: -Patient's last LDL cholesterol was*** -Continue***  4.  Dyspnea on exertion: -Patient reported dyspnea in  2022 and underwent exercise Myoview that was normal. -Today patient reports***      Disposition: Follow-up with Lance Muss, MD or APP in *** months {Are you ordering a CV Procedure (e.g. stress test, cath, DCCV, TEE, etc)?   Press F2        :[id number]}   Medication Adjustments/Labs and Tests Ordered: Current medicines are reviewed at length with the patient today.  Concerns regarding medicines are outlined above.   Signed, Napoleon Form, Leodis Rains, NP 03/26/2023, 3:21 PM [facility]

## 2023-03-27 ENCOUNTER — Encounter: Payer: Self-pay | Admitting: Nurse Practitioner

## 2023-03-27 ENCOUNTER — Ambulatory Visit: Payer: Medicare HMO | Attending: Nurse Practitioner | Admitting: Nurse Practitioner

## 2023-03-27 VITALS — BP 128/72 | HR 72 | Ht 61.5 in | Wt 144.8 lb

## 2023-03-27 DIAGNOSIS — I25708 Atherosclerosis of coronary artery bypass graft(s), unspecified, with other forms of angina pectoris: Secondary | ICD-10-CM

## 2023-03-27 DIAGNOSIS — E785 Hyperlipidemia, unspecified: Secondary | ICD-10-CM

## 2023-03-27 DIAGNOSIS — R0609 Other forms of dyspnea: Secondary | ICD-10-CM | POA: Diagnosis not present

## 2023-03-27 DIAGNOSIS — I10 Essential (primary) hypertension: Secondary | ICD-10-CM | POA: Diagnosis not present

## 2023-03-27 MED ORDER — NITROGLYCERIN 0.4 MG SL SUBL: 0.4000 mg | SUBLINGUAL_TABLET | SUBLINGUAL | 3 refills | Status: DC | PRN

## 2023-03-27 NOTE — Patient Instructions (Signed)
Medication Instructions:  Your physician recommends that you continue on your current medications as directed. Please refer to the Current Medication list given to you today. *If you need a refill on your cardiac medications before your next appointment, please call your pharmacy*   Lab Work: FASTING- LIPIDS & LFT's If you have labs (blood work) drawn today and your tests are completely normal, you will receive your results only by: MyChart Message (if you have MyChart) OR A paper copy in the mail If you have any lab test that is abnormal or we need to change your treatment, we will call you to review the results.   Testing/Procedures: None ordered   Follow-Up: At [facility], you and your health needs are our priority.  As part of our continuing mission to provide you with exceptional heart care, we have created designated Provider Care Teams.  These Care Teams include your primary Cardiologist (physician) and Advanced Practice Providers (APPs -  Physician Assistants and Nurse Practitioners) who all work together to provide you with the care you need, when you need it.  We recommend signing up for the patient portal called "MyChart".  Sign up information is provided on this After Visit Summary.  MyChart is used to connect with patients for Virtual Visits (Telemedicine).  Patients are able to view lab/test results, encounter notes, upcoming appointments, etc.  Non-urgent messages can be sent to your provider as well.   To learn more about what you can do with MyChart, go to [email or web address].    Your next appointment:   12 month(s)  Provider:   Lance Muss, MD     Other Instructions

## 2023-03-28 ENCOUNTER — Ambulatory Visit: Payer: Medicare HMO | Attending: Interventional Cardiology

## 2023-03-28 DIAGNOSIS — R0609 Other forms of dyspnea: Secondary | ICD-10-CM

## 2023-03-28 DIAGNOSIS — I10 Essential (primary) hypertension: Secondary | ICD-10-CM

## 2023-03-28 DIAGNOSIS — E785 Hyperlipidemia, unspecified: Secondary | ICD-10-CM | POA: Diagnosis not present

## 2023-03-28 DIAGNOSIS — I25708 Atherosclerosis of coronary artery bypass graft(s), unspecified, with other forms of angina pectoris: Secondary | ICD-10-CM | POA: Diagnosis not present

## 2023-03-28 LAB — LIPID PANEL
Chol/HDL Ratio: 2.4 ratio (ref 0.0–4.4)
Cholesterol, Total: 186 mg/dL (ref 100–199)
HDL: 79 mg/dL (ref >39)
LDL Chol Calc (NIH): 89 mg/dL (ref 0–99)
Triglycerides: 101 mg/dL (ref 0–149)
VLDL Cholesterol Cal: 18 mg/dL (ref 5–40)

## 2023-03-28 LAB — HEPATIC FUNCTION PANEL
ALT: 20 IU/L (ref 0–32)
AST: 29 IU/L (ref 0–40)
Albumin: 4.4 g/dL (ref 3.8–4.8)
Alkaline Phosphatase: 70 IU/L (ref 44–121)
Bilirubin Total: 0.4 mg/dL (ref 0.0–1.2)
Bilirubin, Direct: 0.11 mg/dL (ref 0.00–0.40)
Total Protein: 6.8 g/dL (ref 6.0–8.5)

## 2023-04-05 DIAGNOSIS — Z0000 Encounter for general adult medical examination without abnormal findings: Secondary | ICD-10-CM | POA: Diagnosis not present

## 2023-04-05 DIAGNOSIS — E559 Vitamin D deficiency, unspecified: Secondary | ICD-10-CM | POA: Diagnosis not present

## 2023-04-05 DIAGNOSIS — I2510 Atherosclerotic heart disease of native coronary artery without angina pectoris: Secondary | ICD-10-CM | POA: Diagnosis not present

## 2023-04-05 DIAGNOSIS — I10 Essential (primary) hypertension: Secondary | ICD-10-CM | POA: Diagnosis not present

## 2023-04-05 DIAGNOSIS — Z79899 Other long term (current) drug therapy: Secondary | ICD-10-CM | POA: Diagnosis not present

## 2023-04-05 DIAGNOSIS — Z1331 Encounter for screening for depression: Secondary | ICD-10-CM | POA: Diagnosis not present

## 2023-04-18 DIAGNOSIS — N179 Acute kidney failure, unspecified: Secondary | ICD-10-CM | POA: Diagnosis not present

## 2023-05-04 DIAGNOSIS — L815 Leukoderma, not elsewhere classified: Secondary | ICD-10-CM | POA: Diagnosis not present

## 2023-05-04 DIAGNOSIS — B078 Other viral warts: Secondary | ICD-10-CM | POA: Diagnosis not present

## 2023-05-04 DIAGNOSIS — D225 Melanocytic nevi of trunk: Secondary | ICD-10-CM | POA: Diagnosis not present

## 2023-05-04 DIAGNOSIS — L821 Other seborrheic keratosis: Secondary | ICD-10-CM | POA: Diagnosis not present

## 2023-05-04 DIAGNOSIS — Z789 Other specified health status: Secondary | ICD-10-CM | POA: Diagnosis not present

## 2023-05-04 DIAGNOSIS — R238 Other skin changes: Secondary | ICD-10-CM | POA: Diagnosis not present

## 2023-05-04 DIAGNOSIS — L814 Other melanin hyperpigmentation: Secondary | ICD-10-CM | POA: Diagnosis not present

## 2023-05-09 DIAGNOSIS — E871 Hypo-osmolality and hyponatremia: Secondary | ICD-10-CM | POA: Diagnosis not present

## 2023-05-24 ENCOUNTER — Ambulatory Visit: Payer: Commercial Managed Care - PPO | Admitting: Podiatry

## 2023-05-30 ENCOUNTER — Ambulatory Visit (INDEPENDENT_AMBULATORY_CARE_PROVIDER_SITE_OTHER): Payer: Medicare HMO | Admitting: Podiatry

## 2023-05-30 ENCOUNTER — Encounter: Payer: Self-pay | Admitting: Podiatry

## 2023-05-30 DIAGNOSIS — M79675 Pain in left toe(s): Secondary | ICD-10-CM

## 2023-05-30 DIAGNOSIS — B351 Tinea unguium: Secondary | ICD-10-CM

## 2023-05-30 DIAGNOSIS — M79674 Pain in right toe(s): Secondary | ICD-10-CM | POA: Diagnosis not present

## 2023-05-30 NOTE — Progress Notes (Signed)
This patient presents to the office with thick painful big toenails  both feet.   She presents to the office for evaluation and treatment.  Patient takes plavix.  Vascular  Dorsalis pedis and posterior tibial pulses are palpable  B/L.  Capillary return  WNL.  Temperature gradient is  WNL.  Skin turgor  WNL  Sensorium  Senn Weinstein monofilament wire  WNL. Normal tactile sensation.  Nail Exam  Patient has nails with thickness noted hallux toenails.  Significant pincer toenails hallux  B/L.  No evidence of infection.    Orthopedic  Exam  Muscle tone and muscle strength  WNL.  No limitations of motion feet  B/L.  No crepitus or joint effusion noted.  Foot type is unremarkable and digits show no abnormalities.  Severe HAV  B/L.  Skin  No open lesions.  Normal skin texture and turgor.   Onychomycosis  Hallux nails  B/L.  IE.  Discussed conservative and surgical options were discussed.  Debrided hallux toenails.     Helane Gunther DPM

## 2023-06-09 DIAGNOSIS — I10 Essential (primary) hypertension: Secondary | ICD-10-CM | POA: Diagnosis not present

## 2023-06-09 DIAGNOSIS — I2510 Atherosclerotic heart disease of native coronary artery without angina pectoris: Secondary | ICD-10-CM | POA: Diagnosis not present

## 2023-07-14 DIAGNOSIS — H5203 Hypermetropia, bilateral: Secondary | ICD-10-CM | POA: Diagnosis not present

## 2023-07-14 DIAGNOSIS — H2513 Age-related nuclear cataract, bilateral: Secondary | ICD-10-CM | POA: Diagnosis not present

## 2023-07-19 DIAGNOSIS — Z0100 Encounter for examination of eyes and vision without abnormal findings: Secondary | ICD-10-CM | POA: Diagnosis not present

## 2023-08-22 ENCOUNTER — Encounter: Payer: Self-pay | Admitting: Podiatry

## 2023-08-22 ENCOUNTER — Ambulatory Visit: Payer: Medicare HMO | Admitting: Podiatry

## 2023-08-22 DIAGNOSIS — B351 Tinea unguium: Secondary | ICD-10-CM

## 2023-08-22 DIAGNOSIS — M79674 Pain in right toe(s): Secondary | ICD-10-CM | POA: Diagnosis not present

## 2023-08-22 DIAGNOSIS — M79675 Pain in left toe(s): Secondary | ICD-10-CM

## 2023-08-22 NOTE — Progress Notes (Signed)
This patient presents to the office with thick painful big toenails  both feet.   She presents to the office for evaluation and treatment.  Patient takes plavix.  Vascular  Dorsalis pedis and posterior tibial pulses are palpable  B/L.  Capillary return  WNL.  Temperature gradient is  WNL.  Skin turgor  WNL  Sensorium  Senn Weinstein monofilament wire  WNL. Normal tactile sensation.  Nail Exam  Patient has nails with thickness noted hallux toenails.  Significant pincer toenails hallux  B/L.  No evidence of infection.    Orthopedic  Exam  Muscle tone and muscle strength  WNL.  No limitations of motion feet  B/L.  No crepitus or joint effusion noted.  Foot type is unremarkable and digits show no abnormalities.  Severe HAV  B/L.  Skin  No open lesions.  Normal skin texture and turgor.   Onychomycosis  Hallux nails  B/L.    Debrided hallux toenails.     Helane Gunther DPM

## 2023-09-28 DIAGNOSIS — R69 Illness, unspecified: Secondary | ICD-10-CM | POA: Diagnosis not present

## 2023-10-08 ENCOUNTER — Emergency Department (HOSPITAL_COMMUNITY): Payer: Medicare HMO

## 2023-10-08 ENCOUNTER — Encounter (HOSPITAL_COMMUNITY): Payer: Self-pay

## 2023-10-08 ENCOUNTER — Emergency Department (HOSPITAL_COMMUNITY)
Admission: EM | Admit: 2023-10-08 | Discharge: 2023-10-08 | Disposition: A | Discharge status: Home / Self Care | Payer: Medicare HMO | Attending: Emergency Medicine | Admitting: Emergency Medicine

## 2023-10-08 ENCOUNTER — Other Ambulatory Visit: Payer: Self-pay

## 2023-10-08 DIAGNOSIS — R0789 Other chest pain: Secondary | ICD-10-CM | POA: Diagnosis not present

## 2023-10-08 DIAGNOSIS — R519 Headache, unspecified: Secondary | ICD-10-CM | POA: Insufficient documentation

## 2023-10-08 DIAGNOSIS — Z79899 Other long term (current) drug therapy: Secondary | ICD-10-CM | POA: Insufficient documentation

## 2023-10-08 DIAGNOSIS — I700 Atherosclerosis of aorta: Secondary | ICD-10-CM | POA: Diagnosis not present

## 2023-10-08 DIAGNOSIS — R079 Chest pain, unspecified: Secondary | ICD-10-CM | POA: Diagnosis not present

## 2023-10-08 DIAGNOSIS — I2510 Atherosclerotic heart disease of native coronary artery without angina pectoris: Secondary | ICD-10-CM | POA: Diagnosis not present

## 2023-10-08 DIAGNOSIS — I10 Essential (primary) hypertension: Secondary | ICD-10-CM | POA: Insufficient documentation

## 2023-10-08 LAB — HEPATIC FUNCTION PANEL
ALT: 20 U/L (ref 0–44)
AST: 30 U/L (ref 15–41)
Albumin: 4 g/dL (ref 3.5–5.0)
Alkaline Phosphatase: 67 U/L (ref 38–126)
Bilirubin, Direct: <0.1 mg/dL (ref 0.0–0.2)
Total Bilirubin: 0.6 mg/dL (ref <1.2)
Total Protein: 7 g/dL (ref 6.5–8.1)

## 2023-10-08 LAB — BASIC METABOLIC PANEL
Anion gap: 13 (ref 5–15)
BUN: 16 mg/dL (ref 8–23)
CO2: 21 mmol/L — ABNORMAL LOW (ref 22–32)
Calcium: 9.3 mg/dL (ref 8.9–10.3)
Chloride: 100 mmol/L (ref 98–111)
Creatinine, Ser: 0.97 mg/dL (ref 0.44–1.00)
GFR, Estimated: >60 mL/min (ref >60)
Glucose, Bld: 114 mg/dL — ABNORMAL HIGH (ref 70–99)
Potassium: 3.6 mmol/L (ref 3.5–5.1)
Sodium: 134 mmol/L — ABNORMAL LOW (ref 135–145)

## 2023-10-08 LAB — CBC
HCT: 35 % — ABNORMAL LOW (ref 36.0–46.0)
Hemoglobin: 11.8 g/dL — ABNORMAL LOW (ref 12.0–15.0)
MCH: 29.9 pg (ref 26.0–34.0)
MCHC: 33.7 g/dL (ref 30.0–36.0)
MCV: 88.8 fL (ref 80.0–100.0)
Platelets: 261 10*3/uL (ref 150–400)
RBC: 3.94 MIL/uL (ref 3.87–5.11)
RDW: 12.5 % (ref 11.5–15.5)
WBC: 9.3 10*3/uL (ref 4.0–10.5)
nRBC: 0 % (ref 0.0–0.2)

## 2023-10-08 LAB — TROPONIN I (HIGH SENSITIVITY)
Troponin I (High Sensitivity): 5 ng/L (ref <18)
Troponin I (High Sensitivity): 8 ng/L (ref <18)

## 2023-10-08 LAB — LIPASE, BLOOD: Lipase: 35 U/L (ref 11–51)

## 2023-10-08 LAB — D-DIMER, QUANTITATIVE: D-Dimer, Quant: 0.5 ug{FEU}/mL (ref 0.00–0.50)

## 2023-10-08 MED ORDER — ACETAMINOPHEN 325 MG PO TABS
650.0000 mg | ORAL_TABLET | Freq: Once | ORAL | Status: AC
  Administered 2023-10-08: 650 mg via ORAL
  Filled 2023-10-08: qty 2

## 2023-10-08 MED ORDER — LIDOCAINE VISCOUS HCL 2 % MT SOLN
15.0000 mL | Freq: Once | OROMUCOSAL | Status: AC
  Administered 2023-10-08: 15 mL via ORAL
  Filled 2023-10-08: qty 15

## 2023-10-08 MED ORDER — PANTOPRAZOLE SODIUM 20 MG PO TBEC: 20.0000 mg | DELAYED_RELEASE_TABLET | Freq: Every day | ORAL | 0 refills | Status: DC

## 2023-10-08 MED ORDER — ALUM & MAG HYDROXIDE-SIMETH 200-200-20 MG/5ML PO SUSP
30.0000 mL | Freq: Once | ORAL | Status: AC
  Administered 2023-10-08: 30 mL via ORAL
  Filled 2023-10-08: qty 30

## 2023-10-08 NOTE — ED Notes (Signed)
EM Provider at bedside.

## 2023-10-08 NOTE — ED Notes (Addendum)
Pt in NAD at d/c from ED. A&O. Ambulatory. Respirations even & unlabored. Skin warm & Dry. Pt verbalized understanding of d/c teaching including follow up care, medications and reasons to return to the ED. No needs or questions expressed at d/c. IV removed.

## 2023-10-08 NOTE — ED Provider Notes (Signed)
[place] EMERGENCY DEPARTMENT AT [facility] Provider Note   CSN: [id number] Arrival date & time: 10/08/23  1317     History  Chief Complaint  Patient presents with   Chest Pain    HAIDEN TOPPIN is a 77 y.o. female history of coronary artery stent, hypertension, DJD in the cervical region, CAD presented with chest pain that has been on and off for the past few days.  Patient states that this is a discomfort in her chest and sometimes feels as though his indigestion.  Patient does not take any acid reflux medications.  Chest pain does not radiate.  Patient is unsure what makes chest pain better or worse.  Patient denies any shortness of breath or leg swelling or or pain radiating to her back, abdomen, neck, left arm.  Patient states that she is concerned as she has had coronary artery being stented in the past and is concerned this could be a similar event.  Patient also notes that she does have a headache as well that started when she got to the ER that was not sudden and maximal at onset.  Patient denies any difficulties ambulating, vision changes, jaw claudication, paresthesias or new onset weakness.  Patient states this headache feels like a band around her head she has not taken any medications for this and feels that this is similar to previous headaches.  Patient does note that in triage her blood pressure was slightly elevated although she is unsure of the number but states at home her blood pressure normally runs at 140/150 systolic.   Chest Pain      Home Medications Prior to Admission medications   Medication Sig Start Date End Date Taking? Authorizing Provider  pantoprazole (PROTONIX) 20 MG tablet Take 1 tablet (20 mg total) by mouth daily. 10/08/23 11/07/23 Yes Trinitey Roache, Beverly Gust, PA-C  acetaminophen (TYLENOL) 325 MG tablet Take 325-650 mg by mouth every 6 (six) hours as needed (for pain).    [provider]  Calcium-Magnesium 200-50 MG TABS Take 1 tablet by  mouth 3 (three) times a week.    [provider]  Cholecalciferol (VITAMIN D3) 1000 UNITS CAPS Take 1,000 Units by mouth daily.    [provider]  clopidogrel (PLAVIX) 75 MG tablet TAKE 1 TABLET EVERY DAY 03/18/22   Corky Crafts, MD  hydrochlorothiazide (MICROZIDE) 12.5 MG capsule Take 12.5 mg by mouth daily. 08/23/22   [provider]  lisinopril (ZESTRIL) 10 MG tablet Take 10 mg by mouth daily. 06/20/22   [provider]  Multiple Vitamin (MULTIVITAMIN) tablet Take 1 tablet by mouth at bedtime.    [provider]  nitroGLYCERIN (NITROSTAT) 0.4 MG SL tablet Place 1 tablet (0.4 mg total) under the tongue every 5 (five) minutes x 3 doses as needed for chest pain. 03/27/23   Gaston Islam., NP  Omega-3 Fatty Acids (FISH OIL) 1200 MG CAPS Take 1,200 mg by mouth daily.    [provider]  rosuvastatin (CRESTOR) 20 MG tablet TAKE 1 TABLET EVERY DAY 06/01/22   Corky Crafts, MD  triamcinolone cream (KENALOG) 0.1 % Apply 1 application topically 2 (two) times daily as needed (for poison ivy exposure.).  03/13/15   [provider]      Allergies    Patient has no known allergies.    Review of Systems   Review of Systems  Cardiovascular:  Positive for chest pain.    Physical Exam Updated Vital Signs BP Marland Kitchen)  140/71   Pulse 70   Temp 98.5 F (36.9 C) (Oral)   Resp 13   Ht 5\' 2"  (1.575 m)   Wt 64.9 kg   SpO2 100%   BMI 26.16 kg/m  Physical Exam Constitutional:      Appearance: She is normal weight.  HENT:     Head: Normocephalic.     Comments: No temporal artery tenderness No jaw claudication Eyes:     Extraocular Movements: Extraocular movements intact.     Conjunctiva/sclera: Conjunctivae normal.     Pupils: Pupils are equal, round, and reactive to light.  Cardiovascular:     Rate and Rhythm: Normal rate and regular rhythm.     Pulses: Normal pulses.     Heart sounds: Normal heart sounds.  Pulmonary:      Effort: Pulmonary effort is normal.     Breath sounds: Normal breath sounds.  Abdominal:     Palpations: Abdomen is soft.     Tenderness: There is no abdominal tenderness. There is no guarding or rebound.  Musculoskeletal:        General: Normal range of motion.     Cervical back: Normal range of motion. No rigidity or tenderness.     Right lower leg: No tenderness. No edema.     Left lower leg: No tenderness. No edema.  Skin:    General: Skin is warm and dry.     Capillary Refill: Capillary refill takes less than 2 seconds.  Neurological:     General: No focal deficit present.     Mental Status: She is alert.     Sensory: Sensation is intact.     Motor: Motor function is intact.     Coordination: Coordination is intact.     Gait: Gait is intact.     Comments: Cranial nerves III through XII intact Vision grossly intact Sensation tact in all 4 extremities  Psychiatric:        Mood and Affect: Mood normal.     ED Results / Procedures / Treatments   Labs (all labs ordered are listed, but only abnormal results are displayed) Labs Reviewed  BASIC METABOLIC PANEL - Abnormal; Notable for the following components:      Result Value   Sodium 134 (*)    CO2 21 (*)    Glucose, Bld 114 (*)    All other components within normal limits  CBC - Abnormal; Notable for the following components:   Hemoglobin 11.8 (*)    HCT 35.0 (*)    All other components within normal limits  HEPATIC FUNCTION PANEL  LIPASE, BLOOD  D-DIMER, QUANTITATIVE (NOT AT [facility])  TROPONIN I (HIGH SENSITIVITY)  TROPONIN I (HIGH SENSITIVITY)    EKG EKG Interpretation Date/Time:  Sunday October 08 2023 15:33:55 EST Ventricular Rate:  72 PR Interval:  158 QRS Duration:  96 QT Interval:  386 QTC Calculation: 423 R Axis:   73  Text Interpretation: Sinus rhythm Low voltage, precordial leads Confirmed by Vivi Barrack [phone]) on 10/08/2023 6:48:01 PM  Radiology CT Head Wo Contrast  Result Date:  10/08/2023 CLINICAL DATA:  HTN HA EXAM: CT HEAD WITHOUT CONTRAST TECHNIQUE: Contiguous axial images were obtained from the base of the skull through the vertex without intravenous contrast. RADIATION DOSE REDUCTION: This exam was performed according to the departmental dose-optimization program which includes automated exposure control, adjustment of the mA and/or kV according to patient size and/or use of iterative reconstruction technique. COMPARISON:  None Available. FINDINGS: Brain: No evidence  of large-territorial acute infarction. No parenchymal hemorrhage. No mass lesion. No extra-axial collection. No mass effect or midline shift. No hydrocephalus. Basilar cisterns are patent. Vascular: No hyperdense vessel. Skull: No acute fracture or focal lesion. Sinuses/Orbits: Paranasal sinuses and mastoid air cells are clear. The orbits are unremarkable. Other: None. IMPRESSION: No acute intracranial abnormality. Electronically Signed   By: Tish Frederickson M.D.   On: 10/08/2023 18:25   DG Chest 2 View  Result Date: 10/08/2023 CLINICAL DATA:  Chest pain EXAM: CHEST - 2 VIEW COMPARISON:  Chest x-ray 01/07/2013. FINDINGS: No consolidation, pneumothorax or effusion. Normal cardiopericardial silhouette. No edema. Calcified aorta. Fixation hardware along the lower cervical spine at the edge of the imaging field. IMPRESSION: No acute cardiopulmonary disease. Electronically Signed   By: Karen Kays M.D.   On: 10/08/2023 14:30    Procedures Procedures    Medications Ordered in ED Medications  acetaminophen (TYLENOL) tablet 650 mg (650 mg Oral Given 10/08/23 1816)  alum & mag hydroxide-simeth (MAALOX/MYLANTA) 200-200-20 MG/5ML suspension 30 mL (30 mLs Oral Given 10/08/23 1816)    And  lidocaine (XYLOCAINE) 2 % viscous mouth solution 15 mL (15 mLs Oral Given 10/08/23 1816)    ED Course/ Medical Decision Making/ A&P             HEART Score: 5                    Medical Decision Making Amount and/or Complexity  of Data Reviewed Labs: ordered. Radiology: ordered.  Risk OTC drugs. Prescription drug management.   Kelby Fam 77 y.o. presented today for chest pain. Working DDx that I considered at this time includes, but not limited to, ACS, GERD, pulmonary embolism, community-acquired pneumonia, aortic dissection, pneumothorax, underlying bony abnormality, anemia, thyrotoxicosis, esophageal rupture, CHF exacerbation, valvular disorder, myocarditis, pericarditis, endocarditis, pericardial effusion/cardiac tamponade, pulmonary edema, gastritis/PUD, esophagitis.  R/o Dx: ACS, pulmonary embolism, community-acquired pneumonia, aortic dissection, pneumothorax, underlying bony abnormality, anemia, thyrotoxicosis, esophageal rupture, CHF exacerbation, valvular disorder, myocarditis, pericarditis, endocarditis, pericardial effusion/cardiac tamponade, pulmonary edema: These are considered less likely due to history of present illness and physical exam findings. Aortic Dissection: less likely based on the location, quality, onset, and severity of symptoms in this case. Patient also has a lack of underlying history of AD or TAA.   Review of prior external notes: 08/22/2023 office visit  Unique Tests and My Interpretation:  EKG: Rate, rhythm, axis, intervals all examined and without medically relevant abnormality. ST segments without concerns for elevations Troponin: 5, 8 CXR: No acute changes CBC: Unremarkable BMP: Unremarkable Lipase: Unremarkable  Hepatic function panel:unremarkable CT head without contrast: Unremarkable D-dimer: Negative  Social Determinants of Health: none  Discussion with Independent Historian:  Family member  Discussion of Management of Tests: None  Risk: Medium: prescription drug management  Risk Stratification Score: HEART Score: 5  Staffed with Jearld Fenton, MD  Plan: On exam patient was in in no acute distress.  Patient's vitals on the monitor were reassuring however they  did not have the blood pressure cuff hooked up and so we will get blood pressure to recheck that is in triage was 192/74 which is drastically different than what patient has been reading at home.  Patient said that she was having a bandlike headache that started when she got here and so in conjunction with the new blood pressure and the headache will give Tylenol patient's request to get a CT of the head.  Patient's neurologic exam is reassuring and patient  states this feels similar to previous headaches and so low concern for any life-threatening headaches at this time given her reassuring exam as well.  The rest of patient's physical exam was unremarkable.. Labs and CXR will be ordered from triage however I did add on hepatic function panel and lipase as patient does endorse an indigestion sensation which could indicate acid reflux causing esophagitis as patient did have coffee cake and coffee this morning which could exacerbate this.  Will give GI cocktail as well.  Patient stable at this time.  Attending went to evaluate the patient and husband states that they have done some traveling and so we will add on D-dimer to rule out PE.  D-dimer negative.  The rest of patient's labs are also reassuring.  After patient received GI cocktail her chest pain did subside.  Patient states when she presses on her chest she can cause some chest discomfort and so there may be an MSK component as patient states she has done some heavy lifting recently.  The patient and I had shared decision making as patient does have heart score of 5 and patient feels that she does not require admission at this time and would like to follow-up outpatient with her cardiologist.  Since patient's chest pain did improve with the GI cocktail will give prescription of Protonix.  Patient was given return precautions. Patient stable for discharge at this time.  Patient verbalized understanding of plan.  This chart was dictated using voice  recognition software.  Despite best efforts to proofread,  errors can occur which can change the documentation meaning.         Final Clinical Impression(s) / ED Diagnoses Final diagnoses:  Atypical chest pain    Rx / DC Orders ED Discharge Orders          Ordered    pantoprazole (PROTONIX) 20 MG tablet  Daily        10/08/23 1918              Remi Deter 10/08/23 Adonis Huguenin, MD 10/15/23 [phone]

## 2023-10-08 NOTE — Discharge Instructions (Addendum)
Please follow-up with your cardiologist and primary care provider in regards to be symptoms and ER visit.  Today your labs and imaging are reassuring and you improved with medication.  This may indicate that your chest pain is related to acid reflux and so I have given you a prescription for Protonix.  As we discussed we agreed to have you follow-up outpatient.  Please monitor your symptoms if symptoms change or worsen please return to the ER.

## 2023-10-08 NOTE — ED Triage Notes (Signed)
Pt came in via POV d/t CP for the past few days. Generalized pressure all over that was intermittent. Pt reports she thought it could be part anxiety related then this morning she stated that it persists & felt like a heaviness on the upper part of her chest. Denies radiation, A/Ox4, rates her pain 4/10. Also endorses a HA.

## 2023-10-10 ENCOUNTER — Encounter (HOSPITAL_COMMUNITY): Payer: Self-pay

## 2023-10-10 ENCOUNTER — Ambulatory Visit: Payer: Medicare HMO | Attending: Cardiology | Admitting: Cardiology

## 2023-10-10 ENCOUNTER — Encounter: Payer: Self-pay | Admitting: Cardiology

## 2023-10-10 ENCOUNTER — Encounter: Payer: Self-pay | Admitting: *Deleted

## 2023-10-10 VITALS — BP 140/80 | HR 77 | Resp 16 | Ht 62.0 in | Wt 143.0 lb

## 2023-10-10 DIAGNOSIS — I2510 Atherosclerotic heart disease of native coronary artery without angina pectoris: Secondary | ICD-10-CM

## 2023-10-10 DIAGNOSIS — I10 Essential (primary) hypertension: Secondary | ICD-10-CM

## 2023-10-10 DIAGNOSIS — R072 Precordial pain: Secondary | ICD-10-CM

## 2023-10-10 DIAGNOSIS — E782 Mixed hyperlipidemia: Secondary | ICD-10-CM

## 2023-10-10 MED ORDER — LISINOPRIL 20 MG PO TABS: 20.0000 mg | ORAL_TABLET | Freq: Every day | ORAL | 3 refills | Status: DC

## 2023-10-10 MED ORDER — NITROGLYCERIN 0.4 MG SL SUBL: 0.4000 mg | SUBLINGUAL_TABLET | SUBLINGUAL | 3 refills | Status: DC | PRN

## 2023-10-10 NOTE — Progress Notes (Signed)
Cardiology Office Note:  .   Date:  10/10/2023  ID:  [name], DOB June 08, 1946, MRN [id number] PCP: Pcp, No  [facility] Providers Cardiologist:  Truett Mainland, MD PCP: Pcp, No  Chief Complaint  Patient presents with   Coronary artery disease involving coronary bypass graft of    Follow-up      History of Present Illness: .    [name] is a 77 y.o. female with hypertension, CAD  Patient was seen at [facility], ER 2 days ago with complaints of chest pain.  Chest pain was retrosternal, nagging, waxing and waning, noticeable with activity and improved with rest.  EKG, troponin, D-dimer were negative for any acute etiology.  Concurrently, she has noticed higher blood pressure in the last few days.  She has been under stress with family gatherings etc.  Vitals:   10/10/23 0826  BP: (!) 140/80  Pulse: 77  Resp: 16  SpO2: 98%     ROS:  Review of Systems  Cardiovascular:  Positive for chest pain. Negative for dyspnea on exertion, leg swelling, palpitations and syncope.     Studies Reviewed: Marland Kitchen        Independently interpreted Labs 10/08/2023: No significant abnormality Lipid panel 03/28/2023: Cholesterol 186, triglycerides 101, HDL 79, LDL 89  Stress test 2022: The left ventricular ejection fraction is hyperdynamic (>65%). Nuclear stress EF: 70%. Patient exercised according to BRUCE protocol for 7:52min achieving a work load of 8.6 METs. The resting HR was 67bpm and rose to a max HR of 139bpm (93% of maximal, age predicted HR) Blood pressure demonstrated a normal response to exercise. There was no ST segment deviation noted during stress. No T wave inversion was noted during stress. Normal myocardial perfusion with no evidence of ischemia or infarction. The study is normal. This is a low risk study.    Physical Exam:   Physical Exam Vitals and nursing note reviewed.  Constitutional:      General: She is not in acute distress. Neck:      Vascular: No JVD.  Cardiovascular:     Rate and Rhythm: Normal rate and regular rhythm.     Heart sounds: Normal heart sounds. No murmur heard. Pulmonary:     Effort: Pulmonary effort is normal.     Breath sounds: Normal breath sounds. No wheezing or rales.  Musculoskeletal:     Right lower leg: No edema.     Left lower leg: No edema.      VISIT DIAGNOSES:   ICD-10-CM   1. Coronary artery disease involving native coronary artery of native heart without angina pectoris  I25.10     2. Primary hypertension  I10     3. Mixed hyperlipidemia  E78.2        ASSESSMENT AND PLAN: .    [name] is a 77 y.o. female with hypertension, CAD  Chest pain: Some qualities of angina patient with known CAD. Recommend exercise nuclear stress test and echocardiogram. Refilled nitroglycerin and for as needed use.  Hypertension: Uncontrolled.  Increase lisinopril to 20 mg daily.  Check BMP in 1 week. Recommend liberal hydration.  Informed Consent   Shared Decision Making/Informed Consent The risks [chest pain, shortness of breath, cardiac arrhythmias, dizziness, blood pressure fluctuations, myocardial infarction, stroke/transient ischemic attack, nausea, vomiting, allergic reaction, radiation exposure, metallic taste sensation and life-threatening complications (estimated to be 1 in 10,000)], benefits (risk stratification, diagnosing coronary artery disease, treatment guidance) and alternatives of a nuclear stress test  were discussed in detail with [name] and she agrees to proceed.       F/u in 6 months  Signed, Elder Negus, MD

## 2023-10-10 NOTE — Patient Instructions (Signed)
Medication Instructions:   INCREASE YOUR LISINOPRIL TO 20 MG BY MOUTH DAILY  *If you need a refill on your cardiac medications before your next appointment, please call your pharmacy*   Lab Work:  IN ONE WEEK HERE IN THE OFFICE--BMET  If you have labs (blood work) drawn today and your tests are completely normal, you will receive your results only by: MyChart Message (if you have MyChart) OR A paper copy in the mail If you have any lab test that is abnormal or we need to change your treatment, we will call you to review the results.   Testing/Procedures:  Your physician has requested that you have an echocardiogram. Echocardiography is a painless test that uses sound waves to create images of your heart. It provides your doctor with information about the size and shape of your heart and how well your heart's chambers and valves are working. This procedure takes approximately one hour. There are no restrictions for this procedure. Please do NOT wear cologne, perfume, aftershave, or lotions (deodorant is allowed). Please arrive 15 minutes prior to your appointment time.  Please note: We ask at that you not bring children with you during ultrasound (echo/ vascular) testing. Due to room size and safety concerns, children are not allowed in the ultrasound rooms during exams. Our front office staff cannot provide observation of children in our lobby area while testing is being conducted. An adult accompanying a patient to their appointment will only be allowed in the ultrasound room at the discretion of the ultrasound technician under special circumstances. We apologize for any inconvenience.    Your physician has requested that you have en exercise stress myoview. For further information please visit [email or web address] Please follow instruction sheet, as given.    Follow-Up: At [facility], you and your health needs are our priority.  As part of our continuing mission to  provide you with exceptional heart care, we have created designated Provider Care Teams.  These Care Teams include your primary Cardiologist (physician) and Advanced Practice Providers (APPs -  Physician Assistants and Nurse Practitioners) who all work together to provide you with the care you need, when you need it.  We recommend signing up for the patient portal called "MyChart".  Sign up information is provided on this After Visit Summary.  MyChart is used to connect with patients for Virtual Visits (Telemedicine).  Patients are able to view lab/test results, encounter notes, upcoming appointments, etc.  Non-urgent messages can be sent to your provider as well.   To learn more about what you can do with MyChart, go to [email or web address].    Your next appointment:   6 month(s)  Provider:   DR. Rosemary Holms

## 2023-10-11 ENCOUNTER — Telehealth (HOSPITAL_COMMUNITY): Payer: Self-pay | Admitting: *Deleted

## 2023-10-11 NOTE — Telephone Encounter (Signed)
Left message on voicemail per DPR in reference to upcoming appointment scheduled on 10/18/2023 at 7:30 with detailed instructions given per Myocardial Perfusion Study Information Sheet for the test. LM to arrive 15 minutes early, and that it is imperative to arrive on time for appointment to keep from having the test rescheduled. If you need to cancel or reschedule your appointment, please call the office within 24 hours of your appointment. Failure to do so may result in a cancellation of your appointment, and a $50 no show fee. Phone number given for call back for any questions.

## 2023-10-14 ENCOUNTER — Encounter (HOSPITAL_COMMUNITY): Payer: Self-pay | Admitting: Emergency Medicine

## 2023-10-14 ENCOUNTER — Emergency Department (HOSPITAL_COMMUNITY): Payer: Medicare HMO

## 2023-10-14 ENCOUNTER — Inpatient Hospital Stay (HOSPITAL_COMMUNITY)
Admission: EM | Admit: 2023-10-14 | Discharge: 2023-10-17 | DRG: 282 | Disposition: A | Discharge status: Home / Self Care | Payer: Medicare HMO | Attending: Cardiovascular Disease | Admitting: Cardiovascular Disease

## 2023-10-14 DIAGNOSIS — E876 Hypokalemia: Secondary | ICD-10-CM | POA: Diagnosis not present

## 2023-10-14 DIAGNOSIS — R55 Syncope and collapse: Secondary | ICD-10-CM | POA: Diagnosis present

## 2023-10-14 DIAGNOSIS — R0789 Other chest pain: Principal | ICD-10-CM

## 2023-10-14 DIAGNOSIS — Z7902 Long term (current) use of antithrombotics/antiplatelets: Secondary | ICD-10-CM

## 2023-10-14 DIAGNOSIS — I2585 Chronic coronary microvascular dysfunction: Secondary | ICD-10-CM | POA: Diagnosis not present

## 2023-10-14 DIAGNOSIS — E785 Hyperlipidemia, unspecified: Secondary | ICD-10-CM | POA: Diagnosis not present

## 2023-10-14 DIAGNOSIS — Z955 Presence of coronary angioplasty implant and graft: Secondary | ICD-10-CM | POA: Diagnosis not present

## 2023-10-14 DIAGNOSIS — I959 Hypotension, unspecified: Secondary | ICD-10-CM | POA: Diagnosis not present

## 2023-10-14 DIAGNOSIS — Z79899 Other long term (current) drug therapy: Secondary | ICD-10-CM | POA: Diagnosis not present

## 2023-10-14 DIAGNOSIS — E782 Mixed hyperlipidemia: Secondary | ICD-10-CM | POA: Diagnosis present

## 2023-10-14 DIAGNOSIS — I200 Unstable angina: Secondary | ICD-10-CM | POA: Diagnosis not present

## 2023-10-14 DIAGNOSIS — Z7982 Long term (current) use of aspirin: Secondary | ICD-10-CM

## 2023-10-14 DIAGNOSIS — Z823 Family history of stroke: Secondary | ICD-10-CM

## 2023-10-14 DIAGNOSIS — I21B Myocardial infarction with coronary microvascular dysfunction: Secondary | ICD-10-CM | POA: Diagnosis not present

## 2023-10-14 DIAGNOSIS — I10 Essential (primary) hypertension: Secondary | ICD-10-CM | POA: Diagnosis present

## 2023-10-14 DIAGNOSIS — R079 Chest pain, unspecified: Secondary | ICD-10-CM | POA: Diagnosis not present

## 2023-10-14 DIAGNOSIS — Z8249 Family history of ischemic heart disease and other diseases of the circulatory system: Secondary | ICD-10-CM

## 2023-10-14 DIAGNOSIS — I25110 Atherosclerotic heart disease of native coronary artery with unstable angina pectoris: Secondary | ICD-10-CM | POA: Diagnosis not present

## 2023-10-14 DIAGNOSIS — I2510 Atherosclerotic heart disease of native coronary artery without angina pectoris: Secondary | ICD-10-CM | POA: Diagnosis present

## 2023-10-14 DIAGNOSIS — I214 Non-ST elevation (NSTEMI) myocardial infarction: Secondary | ICD-10-CM | POA: Diagnosis not present

## 2023-10-14 HISTORY — DX: Atherosclerotic heart disease of native coronary artery without angina pectoris: I25.10

## 2023-10-14 LAB — CBC
HCT: 35.1 % — ABNORMAL LOW (ref 36.0–46.0)
Hemoglobin: 12.1 g/dL (ref 12.0–15.0)
MCH: 30.2 pg (ref 26.0–34.0)
MCHC: 34.5 g/dL (ref 30.0–36.0)
MCV: 87.5 fL (ref 80.0–100.0)
Platelets: 315 10*3/uL (ref 150–400)
RBC: 4.01 MIL/uL (ref 3.87–5.11)
RDW: 12.2 % (ref 11.5–15.5)
WBC: 7.5 10*3/uL (ref 4.0–10.5)
nRBC: 0 % (ref 0.0–0.2)

## 2023-10-14 LAB — COMPREHENSIVE METABOLIC PANEL
ALT: 16 U/L (ref 0–44)
AST: 26 U/L (ref 15–41)
Albumin: 3.9 g/dL (ref 3.5–5.0)
Alkaline Phosphatase: 62 U/L (ref 38–126)
Anion gap: 13 (ref 5–15)
BUN: 12 mg/dL (ref 8–23)
CO2: 19 mmol/L — ABNORMAL LOW (ref 22–32)
Calcium: 9.2 mg/dL (ref 8.9–10.3)
Chloride: 99 mmol/L (ref 98–111)
Creatinine, Ser: 1 mg/dL (ref 0.44–1.00)
GFR, Estimated: 59 mL/min — ABNORMAL LOW (ref >60)
Glucose, Bld: 125 mg/dL — ABNORMAL HIGH (ref 70–99)
Potassium: 4.1 mmol/L (ref 3.5–5.1)
Sodium: 131 mmol/L — ABNORMAL LOW (ref 135–145)
Total Bilirubin: 0.9 mg/dL (ref <1.2)
Total Protein: 6.6 g/dL (ref 6.5–8.1)

## 2023-10-14 LAB — TROPONIN I (HIGH SENSITIVITY)
Troponin I (High Sensitivity): 4 ng/L (ref <18)
Troponin I (High Sensitivity): 23 ng/L — ABNORMAL HIGH (ref <18)
Troponin I (High Sensitivity): 34 ng/L — ABNORMAL HIGH (ref <18)

## 2023-10-14 LAB — MAGNESIUM: Magnesium: 2 mg/dL (ref 1.7–2.4)

## 2023-10-14 MED ORDER — ASPIRIN 81 MG PO CHEW
324.0000 mg | CHEWABLE_TABLET | Freq: Once | ORAL | Status: AC
  Administered 2023-10-14: 324 mg via ORAL
  Filled 2023-10-14: qty 4

## 2023-10-14 MED ORDER — FENTANYL CITRATE PF 50 MCG/ML IJ SOSY
25.0000 ug | PREFILLED_SYRINGE | Freq: Once | INTRAMUSCULAR | Status: AC
  Administered 2023-10-14: 25 ug via INTRAVENOUS
  Filled 2023-10-14: qty 1

## 2023-10-14 MED ORDER — PANTOPRAZOLE SODIUM 20 MG PO TBEC
20.0000 mg | DELAYED_RELEASE_TABLET | Freq: Every day | ORAL | Status: DC
  Administered 2023-10-14 – 2023-10-17 (×4): 20 mg via ORAL
  Filled 2023-10-14 (×4): qty 1

## 2023-10-14 MED ORDER — METOPROLOL TARTRATE 5 MG/5ML IV SOLN
2.5000 mg | Freq: Once | INTRAVENOUS | Status: AC
  Administered 2023-10-14: 2.5 mg via INTRAVENOUS
  Filled 2023-10-14: qty 5

## 2023-10-14 MED ORDER — ONDANSETRON HCL 4 MG/2ML IJ SOLN: 4.0000 mg | Freq: Four times a day (QID) | INTRAMUSCULAR | Status: DC | PRN

## 2023-10-14 MED ORDER — SODIUM CHLORIDE 0.9 % IV BOLUS
1000.0000 mL | Freq: Once | INTRAVENOUS | Status: AC
  Administered 2023-10-14: 1000 mL via INTRAVENOUS

## 2023-10-14 MED ORDER — ASPIRIN 300 MG RE SUPP: 300.0000 mg | RECTAL | Status: DC

## 2023-10-14 MED ORDER — HEPARIN (PORCINE) 25000 UT/250ML-% IV SOLN
750.0000 [IU]/h | INTRAVENOUS | Status: DC
Start: 2023-10-14 — End: 2023-10-16
  Administered 2023-10-14 – 2023-10-15 (×2): 750 [IU]/h via INTRAVENOUS
  Filled 2023-10-14 (×2): qty 250

## 2023-10-14 MED ORDER — MORPHINE SULFATE (PF) 4 MG/ML IV SOLN
4.0000 mg | Freq: Once | INTRAVENOUS | Status: AC
  Administered 2023-10-14: 4 mg via INTRAVENOUS
  Filled 2023-10-14: qty 1

## 2023-10-14 MED ORDER — FENTANYL CITRATE PF 50 MCG/ML IJ SOSY
50.0000 ug | PREFILLED_SYRINGE | Freq: Once | INTRAMUSCULAR | Status: AC
  Administered 2023-10-15: 50 ug via INTRAVENOUS
  Filled 2023-10-14: qty 1

## 2023-10-14 MED ORDER — ACETAMINOPHEN 325 MG PO TABS: 325.0000 mg | ORAL_TABLET | Freq: Four times a day (QID) | ORAL | Status: DC | PRN

## 2023-10-14 MED ORDER — ROSUVASTATIN CALCIUM 20 MG PO TABS
20.0000 mg | ORAL_TABLET | Freq: Every day | ORAL | Status: DC
  Administered 2023-10-14 – 2023-10-17 (×4): 20 mg via ORAL
  Filled 2023-10-14 (×4): qty 1

## 2023-10-14 MED ORDER — HEPARIN BOLUS VIA INFUSION
3500.0000 [IU] | Freq: Once | INTRAVENOUS | Status: AC
  Administered 2023-10-14: 3500 [IU] via INTRAVENOUS
  Filled 2023-10-14: qty 3500

## 2023-10-14 MED ORDER — ACETAMINOPHEN 325 MG PO TABS
650.0000 mg | ORAL_TABLET | ORAL | Status: DC | PRN
Start: 2023-10-14 — End: 2023-10-17
  Administered 2023-10-14 – 2023-10-16 (×5): 650 mg via ORAL
  Filled 2023-10-14 (×5): qty 2

## 2023-10-14 MED ORDER — NITROGLYCERIN 0.4 MG SL SUBL: 0.4000 mg | SUBLINGUAL_TABLET | SUBLINGUAL | Status: DC | PRN

## 2023-10-14 MED ORDER — ASPIRIN 81 MG PO TBEC
81.0000 mg | DELAYED_RELEASE_TABLET | Freq: Every day | ORAL | Status: DC
  Administered 2023-10-15 – 2023-10-17 (×2): 81 mg via ORAL
  Filled 2023-10-14 (×3): qty 1

## 2023-10-14 MED ORDER — CLOPIDOGREL BISULFATE 75 MG PO TABS
75.0000 mg | ORAL_TABLET | Freq: Every day | ORAL | Status: DC
  Administered 2023-10-14 – 2023-10-17 (×4): 75 mg via ORAL
  Filled 2023-10-14 (×5): qty 1

## 2023-10-14 MED ORDER — ADULT MULTIVITAMIN W/MINERALS CH
1.0000 | ORAL_TABLET | Freq: Every day | ORAL | Status: DC
  Administered 2023-10-15 – 2023-10-16 (×2): 1 via ORAL
  Filled 2023-10-14 (×2): qty 1

## 2023-10-14 MED ORDER — ASPIRIN 81 MG PO CHEW: 324.0000 mg | CHEWABLE_TABLET | ORAL | Status: DC

## 2023-10-14 MED ORDER — LISINOPRIL 20 MG PO TABS
20.0000 mg | ORAL_TABLET | Freq: Every day | ORAL | Status: DC
  Filled 2023-10-14: qty 1

## 2023-10-14 NOTE — ED Provider Notes (Signed)
EMERGENCY DEPARTMENT AT [facility] Provider Note   CSN: [id number] Arrival date & time: 10/14/23  1448     History  Chief Complaint  Patient presents with   Chest Pain   Hypotension    [name] is a 77 y.o. female.  77 year old female with a past medical history of CAD s/p stent placement, HLD, HTN presents here for concerns of chest pain.  Patient describes a squeezing sensation in the central portion of her chest.  Symptoms began 5 days ago.  Initially, they were intermittent.  At the onset of symptoms, she was seen in this emergency department.  Workup at that time was reassuring.  Patient was discharged home.  She followed up with her cardiologist 3 days ago.  They are planning for stress test in the next several days.  She is currently on Plavix, which she has been compliant with.  She had worsening of her chest pain today which is what prompted her to seek evaluation.  She does not feel short of breath.  She did take 2 nitroglycerin, 1 at 2:42 PM and a second as she arrived here.  This caused her to feel lightheadedness.  She had a reported syncopal episode in triage and was emergently brought back for evaluation.  The history is provided by the patient and the spouse.       Home Medications Prior to Admission medications   Medication Sig Start Date End Date Taking? Authorizing Provider  acetaminophen (TYLENOL) 325 MG tablet Take 325-650 mg by mouth every 6 (six) hours as needed (for pain).    [provider]  Calcium-Magnesium 200-50 MG TABS Take 1 tablet by mouth 3 (three) times a week.    [provider]  Cholecalciferol (VITAMIN D3) 1000 UNITS CAPS Take 1,000 Units by mouth daily.    [provider]  clopidogrel (PLAVIX) 75 MG tablet TAKE 1 TABLET EVERY DAY 03/18/22   Corky Crafts, MD  lisinopril (ZESTRIL) 20 MG tablet Take 1 tablet (20 mg total) by mouth daily. 10/10/23   Patwardhan, Anabel Bene, MD  Multiple Vitamin  (MULTIVITAMIN) tablet Take 1 tablet by mouth at bedtime.    [provider]  nitroGLYCERIN (NITROSTAT) 0.4 MG SL tablet Place 1 tablet (0.4 mg total) under the tongue every 5 (five) minutes x 3 doses as needed for chest pain. 10/10/23   Patwardhan, Anabel Bene, MD  Omega-3 Fatty Acids (FISH OIL) 1200 MG CAPS Take 1,200 mg by mouth daily.    [provider]  pantoprazole (PROTONIX) 20 MG tablet Take 1 tablet (20 mg total) by mouth daily. 10/08/23 11/07/23  Evlyn Kanner T, PA-C  rosuvastatin (CRESTOR) 20 MG tablet TAKE 1 TABLET EVERY DAY 06/01/22   Corky Crafts, MD  triamcinolone cream (KENALOG) 0.1 % Apply 1 application topically 2 (two) times daily as needed (for poison ivy exposure.).  03/13/15   [provider]      Allergies    Patient has no known allergies.    Review of Systems   As noted in HPI  Physical Exam Updated Vital Signs BP (!) 81/52 (BP Location: Left Arm)   Pulse 90   Temp 98 F (36.7 C)   Resp 20   SpO2 98%  Physical Exam Vitals reviewed.  Constitutional:      General: She is not in acute distress.    Appearance: She is normal weight. She is not ill-appearing, toxic-appearing or diaphoretic.  Eyes:  Conjunctiva/sclera: Conjunctivae normal.  Cardiovascular:     Rate and Rhythm: Normal rate and regular rhythm.     Pulses: Normal pulses.          Radial pulses are 2+ on the right side and 2+ on the left side.       Dorsalis pedis pulses are 2+ on the right side and 2+ on the left side.     Heart sounds: Normal heart sounds. No murmur heard.    No friction rub. No gallop.  Pulmonary:     Effort: Pulmonary effort is normal. No respiratory distress.     Breath sounds: Normal breath sounds. No wheezing, rhonchi or rales.  Abdominal:     General: There is no distension.     Palpations: Abdomen is soft.     Tenderness: There is no abdominal tenderness. There is no guarding or rebound.  Musculoskeletal:     Right lower leg: No edema.      Left lower leg: No edema.  Skin:    General: Skin is warm and dry.  Neurological:     Mental Status: She is alert.     ED Results / Procedures / Treatments   Labs (all labs ordered are listed, but only abnormal results are displayed) Labs Reviewed - No data to display  EKG EKG Interpretation Date/Time:  Saturday October 14 2023 15:02:43 EST Ventricular Rate:  98 PR Interval:  144 QRS Duration:  91 QT Interval:  357 QTC Calculation: 456 R Axis:   74  Text Interpretation: Sinus rhythm when compared to prior, similar appearance. No STEMI Confirmed by Theda Belfast ([id number]) on 10/14/2023 3:07:38 PM  Radiology No results found.  Procedures Procedures    Medications Ordered in ED Medications - No data to display  ED Course/ Medical Decision Making/ A&P Clinical Course as of 10/14/23 2323  Sat Oct 14, 2023  1542 DG Chest Portable 1 View I independently reviewed this image.  Do not appreciate any focal opacities.  No cardiomegaly noted.  No mediastinal widening.  No evidence of pneumothorax. [JR]  1546 Sinus rhythm.  Rate 90.  Normal intervals.  No axis deviation.  No ST segment changes.  Nonischemic ECG. [JR]  0272 Follows with Dr. Rosemary Holms for cardiology  [JR]  [phone] Spoke with Dr. Teena Dunk, cardiology, who recommends initiating heparin infusion and lopressor for rate control. [JR]    Clinical Course User Index [JR] Rolla Flatten, MD                                 Medical Decision Making Amount and/or Complexity of Data Reviewed Independent Historian: spouse Labs: ordered. Radiology: ordered and independent interpretation performed. Decision-making details documented in ED Course. ECG/medicine tests: ordered and independent interpretation performed.  Risk OTC drugs. Prescription drug management. Decision regarding hospitalization.   77 year old female presents here for worsening of recent episode of intermittent chest pain that is been going on for  approximately the last 1 week.  Patient was found to be hypotensive in triage.  Immediately brought back for evaluation.  Upon my initial evaluation, she is awake and alert.  She is complaining of chest pain.  Blood pressure has not improved with her in reverse Trendelenburg position.  Cardiopulmonary exam is unremarkable.  She has 2+ peripheral pulses in bilateral upper and lower extremities.  I did not appreciate any lower extremity or peripheral edema.  Differential includes ACS, PE, pneumothorax, electrolyte abnormality, aortic  dissection.  I independently reviewed the patient's ECG, which is overall nonischemic.  I also independently reviewed the patient's chest x-ray, which is without acute findings.  No evidence of pneumothorax.  I do not appreciate mediastinal widening.  Given the patient has symmetrical pulses and to the duration of her symptoms, I would have a low suspicion for aortic dissection as the etiology of her pain.  She did have a D-dimer done at her initial evaluation the onset of her chest pain on 12/1.  That dimer was negative and not consistent with thromboembolic disease.  I have low suspicion for PE at this time.  She does have a history of unstable angina in the past requiring stenting.  I am most suspicious for ACS.  Will plan to get screening labs, including troponin.  Patient has not taken aspirin at home, will give dose of aspirin here.  She is currently on Plavix and reports being compliant.  Treat patient's pain with fentanyl.  Anticipate involving cardiology and decision making regarding potential ACS.  I independently interpreted the patient's laboratory workup, CMP notable for sodium of 131.  Initial troponin within normal limits at 4.  Repeat elevated at 23.  I discussed the case with cardiology, Dr. Eden Emms, due to concern for unstable angina.  Plan to evaluate the patient but anticipate admission to their service.  They recommend initiation of heparin infusion.  Also  still having pain after fentanyl, will manage pain with IV morphine.  Allergy has evaluated the patient and as accepted admission her for admission to their service.  Patient's presentation is most consistent with acute presentation with potential threat to life or bodily function.         Final Clinical Impression(s) / ED Diagnoses Final diagnoses:  Atypical chest pain    Rx / DC Orders ED Discharge Orders     None         Rolla Flatten, MD 10/14/23 2326    Tegeler, Canary Brim, MD 10/14/23 [phone]

## 2023-10-14 NOTE — ED Triage Notes (Signed)
Pt arrives via POV from home, c/o midsternal CP, pt diaphretic, took 2 nitroglycerin SL, PTA. Pt had a period of unresponsiveness, blank stare unable to answer questions, possible vagal down, unable to palpate pulse. Came to on arrival to Trauma room with possible code activation.

## 2023-10-14 NOTE — Progress Notes (Signed)
ANTICOAGULATION CONSULT NOTE - Initial Consult  Pharmacy Consult for Heparin Indication: chest pain/ACS  No Known Allergies  Patient Measurements:   Heparin Dosing Weight: 63.3 kg  Vital Signs: Temp: 98 F (36.7 C) (12/07 1454) BP: 144/68 (12/07 1715) Pulse Rate: 78 (12/07 1715)  Labs: Recent Labs    10/14/23 1510  HGB 12.1  HCT 35.1*  PLT 315  CREATININE 1.00  TROPONINIHS 4    Estimated Creatinine Clearance: 43 mL/min (by C-G formula based on SCr of 1 mg/dL).   Medical History: Past Medical History:  Diagnosis Date   Coronary artery disease    DJD (degenerative joint disease), cervical    followed by Dr. Darlin Coco planned for 2011   Family history of colon cancer    Hypertension    Stented coronary artery     Medications:  (Not in a hospital admission)  Scheduled:   fentaNYL (SUBLIMAZE) injection  50 mcg Intravenous Once   metoprolol tartrate  2.5 mg Intravenous Once   Infusions:  PRN:   Assessment: 53 yof with a history of HTN, CAD s/p PCI, HLD. Patient is presenting with chest pain and syncope. Heparin per pharmacy consult placed for chest pain/ACS.  Patient is not on anticoagulation prior to arrival.  Hgb 12.1; plt 315  Goal of Therapy:  Heparin level 0.3-0.7 units/ml Monitor platelets by anticoagulation protocol: Yes   Plan:  Give IV heparin 3500 units bolus x 1 Start heparin infusion at 750 units/hr Check anti-Xa level in 8 hours and daily while on heparin Continue to monitor H&H and platelets  Delmar Landau, PharmD, BCPS 10/14/2023 5:39 PM ED Clinical Pharmacist -  [phone]

## 2023-10-14 NOTE — H&P (Signed)
Cardiology Admission History and Physical   Patient ID: [name] MRN: [id number]; DOB: Feb 14, 1946   Admission date: 10/14/2023  PCP:  Thana Ates, MD   [facility] Providers Cardiologist:  Dr. Eldridge Dace -> Dr. Rosemary Holms Chief Complaint:  Chest pain  Patient Profile:   [name] is a 77 y.o. female with HTN, HL, CAD s/p LAD stent in 2013 who is being seen 10/14/2023 for the evaluation of chest pain/[place].  History of Present Illness:   [name] has h/o HTN, Hl and CAD s/p previous LAD stent  in 2013. No cath since.   Seen in ED 12/1 with CR. W/u negative. Had f/u with Dr. Rosemary Holms on 12/3. Scheduled for stress test.   Says chest pain that has been waxing and waning over past several days withg increasing frequency. She says is not exertional or pleuritic today but acutely worsened today. She does say it feels somewhat similar when she had chest pain requiring stenting in the past. She said she tried taking nitro that she was given and took 1 dose without much relief. She then took a second 1 as she was getting here to the ED and subsequently she passed out in the waiting room. Blood pressure was initially undetectable then found to be 81 systolic. Patient rushed back to examination room for eval. Given 1L NS  with improvement in BP.   Now feeling better ECG ok 1st hstrop 4   Past Medical History:  Diagnosis Date   Coronary artery disease    DJD (degenerative joint disease), cervical    followed by Dr. Darlin Coco planned for 2011   Family history of colon cancer    Hypertension    Stented coronary artery     Past Surgical History:  Procedure Laterality Date   CARDIAC CATHETERIZATION  8/12   sten placement-Dr. Eldridge Dace   COLONOSCOPY WITH PROPOFOL N/A 11/22/2016   Procedure: COLONOSCOPY WITH PROPOFOL;  Surgeon: Charolett Bumpers, MD;  Location: [facility];  Service: Endoscopy;  Laterality: N/A;     Medications Prior to Admission: Prior to  Admission medications   Medication Sig Start Date End Date Taking? Authorizing Provider  acetaminophen (TYLENOL) 325 MG tablet Take 325-650 mg by mouth every 6 (six) hours as needed (for pain).    [provider]  Calcium-Magnesium 200-50 MG TABS Take 1 tablet by mouth 3 (three) times a week.    [provider]  Cholecalciferol (VITAMIN D3) 1000 UNITS CAPS Take 1,000 Units by mouth daily.    [provider]  clopidogrel (PLAVIX) 75 MG tablet TAKE 1 TABLET EVERY DAY 03/18/22   Corky Crafts, MD  lisinopril (ZESTRIL) 20 MG tablet Take 1 tablet (20 mg total) by mouth daily. 10/10/23   Patwardhan, Anabel Bene, MD  Multiple Vitamin (MULTIVITAMIN) tablet Take 1 tablet by mouth at bedtime.    [provider]  nitroGLYCERIN (NITROSTAT) 0.4 MG SL tablet Place 1 tablet (0.4 mg total) under the tongue every 5 (five) minutes x 3 doses as needed for chest pain. 10/10/23   Patwardhan, Anabel Bene, MD  Omega-3 Fatty Acids (FISH OIL) 1200 MG CAPS Take 1,200 mg by mouth daily.    [provider]  pantoprazole (PROTONIX) 20 MG tablet Take 1 tablet (20 mg total) by mouth daily. 10/08/23 11/07/23  Evlyn Kanner T, PA-C  rosuvastatin (CRESTOR) 20 MG tablet TAKE 1 TABLET EVERY DAY 06/01/22   Corky Crafts, MD  triamcinolone cream (KENALOG) 0.1 % Apply  1 application topically 2 (two) times daily as needed (for poison ivy exposure.).  03/13/15   [provider]     Allergies:   No Known Allergies  Social History:   Social History   Socioeconomic History   Marital status: Married    Spouse name: Not on file   Number of children: Not on file   Years of education: Not on file   Highest education level: Not on file  Occupational History   Not on file  Tobacco Use   Smoking status: Never   Smokeless tobacco: Never  Substance and Sexual Activity   Alcohol use: Yes    Alcohol/week: 7.0 standard drinks of alcohol    Types: 7 Glasses of wine per week   Drug  use: No   Sexual activity: Yes  Other Topics Concern   Not on file  Social History Narrative   Not on file   Social Determinants of Health   Financial Resource Strain: Not on file  Food Insecurity: Not on file  Transportation Needs: Not on file  Physical Activity: Not on file  Stress: Not on file  Social Connections: Not on file  Intimate Partner Violence: Not on file    Family History:   The patient's family history includes CVA in her mother; Congestive Heart Failure in her mother; Other in her father.    ROS:  Please see the history of present illness.  All other ROS reviewed and negative.     Physical Exam/Data:   Vitals:   10/14/23 1645 10/14/23 1700 10/14/23 1715 10/14/23 1739  BP: (!) 148/65 (!) 141/64 (!) 144/68   Pulse: 89 76 78   Resp: 16 17 13    Temp:      SpO2: 100% 100% 100%   Weight:    64.9 kg  Height:    5\' 2"  (1.575 m)   No intake or output data in the 24 hours ending 10/14/23 1741    10/14/2023    5:39 PM 10/10/2023    8:26 AM 10/08/2023    1:34 PM  Last 3 Weights  Weight (lbs) 143 lb 1.3 oz 143 lb 143 lb  Weight (kg) 64.9 kg 64.864 kg 64.864 kg     Body mass index is 26.17 kg/m.  General:  Well appearing. No resp difficulty HEENT: normal Neck: supple. no JVD. Carotids 2+ bilat; no bruits. No lymphadenopathy or thryomegaly appreciated. Cor: PMI nondisplaced. Regular rate & rhythm. No rubs, gallops or murmurs. Lungs: clear Abdomen: soft, nontender, nondistended. No hepatosplenomegaly. No bruits or masses. Good bowel sounds. Extremities: no cyanosis, clubbing, rash, edema Neuro: alert & orientedx3, cranial nerves grossly intact. moves all 4 extremities w/o difficulty. Affect pleasant   EKG:  Sinus rhythm no st-T wave abnormalities.  Relevant CV Studies:   Laboratory Data:  High Sensitivity Troponin:   Recent Labs  Lab 10/08/23 1340 10/08/23 1532 10/14/23 1510  TROPONINIHS 5 8 4       Chemistry Recent Labs  Lab 10/08/23 1340  10/14/23 1510  NA 134* 131*  K 3.6 4.1  CL 100 99  CO2 21* 19*  GLUCOSE 114* 125*  BUN 16 12  CREATININE 0.97 1.00  CALCIUM 9.3 9.2  MG  --  2.0  GFRNONAA >60 59*  ANIONGAP 13 13    Recent Labs  Lab 10/08/23 1340 10/14/23 1510  PROT 7.0 6.6  ALBUMIN 4.0 3.9  AST 30 26  ALT 20 16  ALKPHOS 67 62  BILITOT 0.6 0.9   Lipids  No results for input(s): "CHOL", "TRIG", "HDL", "LABVLDL", "LDLCALC", "CHOLHDL" in the last 168 hours. Hematology Recent Labs  Lab 10/08/23 1340 10/14/23 1510  WBC 9.3 7.5  RBC 3.94 4.01  HGB 11.8* 12.1  HCT 35.0* 35.1*  MCV 88.8 87.5  MCH 29.9 30.2  MCHC 33.7 34.5  RDW 12.5 12.2  PLT 261 315   Thyroid No results for input(s): "TSH", "FREET4" in the last 168 hours. BNPNo results for input(s): "BNP", "PROBNP" in the last 168 hours.  DDimer  Recent Labs  Lab 10/08/23 1819  DDIMER 0.50     Radiology/Studies:  DG Chest Portable 1 View  Result Date: 10/14/2023 CLINICAL DATA:  Chest pain. EXAM: PORTABLE CHEST 1 VIEW COMPARISON:  10/08/2023 and older studies. FINDINGS: Cardiac silhouette is normal in size and configuration. Normal mediastinal and hilar contours. Clear lungs.  No pleural effusion or pneumothorax. Skeletal structures are grossly intact. IMPRESSION: No active disease. Electronically Signed   By: Amie Portland M.D.   On: 10/14/2023 15:23     Assessment and Plan:   1. CAD/USA - h/o LAD stent 2013 - chest pain concerning for [place]. ECG and first hstrop normal - will plan admit - continue ASA/Plavix/statin - start heparin/b-blocker - Cath Monday.  - I have reviewed the risks, indications, and alternatives to angioplasty and stenting with the patient. Risks include but are not limited to bleeding, infection, vascular injury, stroke, myocardial infection, arrhythmia, kidney injury, radiation-related injury in the case of prolonged fluoroscopy use, emergency cardiac surgery, and death. The patient understands the risks of serious  complication is low (<1%) and he agrees to proceed.    2. HTN - BP labile here in setting of sl NTG - follow  3. HL - continue high-potency statin - check lipids  Risk Assessment/Risk Scores:          Code Status: Full Code  Severity of Illness: The appropriate patient status for this patient is INPATIENT. Inpatient status is judged to be reasonable and necessary in order to provide the required intensity of service to ensure the patient's safety. The patient's presenting symptoms, physical exam findings, and initial radiographic and laboratory data in the context of their chronic comorbidities is felt to place them at high risk for further clinical deterioration. Furthermore, it is not anticipated that the patient will be medically stable for discharge from the hospital within 2 midnights of admission.   * I certify that at the point of admission it is my clinical judgment that the patient will require inpatient hospital care spanning beyond 2 midnights from the point of admission due to high intensity of service, high risk for further deterioration and high frequency of surveillance required.*   For questions or updates, please contact [facility] Please consult [email or web address] for contact info under     Signed, Arvilla Meres, MD  10/14/2023 5:41 PM

## 2023-10-15 ENCOUNTER — Encounter (HOSPITAL_COMMUNITY): Payer: Self-pay | Admitting: Internal Medicine

## 2023-10-15 ENCOUNTER — Other Ambulatory Visit (HOSPITAL_COMMUNITY): Payer: Medicare HMO

## 2023-10-15 DIAGNOSIS — I214 Non-ST elevation (NSTEMI) myocardial infarction: Secondary | ICD-10-CM | POA: Diagnosis not present

## 2023-10-15 LAB — CBC
HCT: 31.3 % — ABNORMAL LOW (ref 36.0–46.0)
Hemoglobin: 10.7 g/dL — ABNORMAL LOW (ref 12.0–15.0)
MCH: 30 pg (ref 26.0–34.0)
MCHC: 34.2 g/dL (ref 30.0–36.0)
MCV: 87.7 fL (ref 80.0–100.0)
Platelets: 244 10*3/uL (ref 150–400)
RBC: 3.57 MIL/uL — ABNORMAL LOW (ref 3.87–5.11)
RDW: 12.1 % (ref 11.5–15.5)
WBC: 7.8 10*3/uL (ref 4.0–10.5)
nRBC: 0 % (ref 0.0–0.2)

## 2023-10-15 LAB — LIPID PANEL
Cholesterol: 125 mg/dL (ref 0–200)
HDL: 73 mg/dL (ref >40)
LDL Cholesterol: 41 mg/dL (ref 0–99)
Total CHOL/HDL Ratio: 1.7 {ratio}
Triglycerides: 53 mg/dL (ref <150)
VLDL: 11 mg/dL (ref 0–40)

## 2023-10-15 LAB — HEPARIN LEVEL (UNFRACTIONATED)
Heparin Unfractionated: 0.53 [IU]/mL (ref 0.30–0.70)
Heparin Unfractionated: 0.51 [IU]/mL (ref 0.30–0.70)

## 2023-10-15 LAB — TROPONIN I (HIGH SENSITIVITY): Troponin I (High Sensitivity): 22 ng/L — ABNORMAL HIGH (ref <18)

## 2023-10-15 LAB — BASIC METABOLIC PANEL
Anion gap: 9 (ref 5–15)
BUN: 10 mg/dL (ref 8–23)
CO2: 22 mmol/L (ref 22–32)
Calcium: 8.8 mg/dL — ABNORMAL LOW (ref 8.9–10.3)
Chloride: 101 mmol/L (ref 98–111)
Creatinine, Ser: 0.9 mg/dL (ref 0.44–1.00)
GFR, Estimated: >60 mL/min (ref >60)
Glucose, Bld: 111 mg/dL — ABNORMAL HIGH (ref 70–99)
Potassium: 3.9 mmol/L (ref 3.5–5.1)
Sodium: 132 mmol/L — ABNORMAL LOW (ref 135–145)

## 2023-10-15 LAB — MRSA NEXT GEN BY PCR, NASAL: MRSA by PCR Next Gen: NOT DETECTED

## 2023-10-15 MED ORDER — NITROGLYCERIN IN D5W 200-5 MCG/ML-% IV SOLN
0.0000 ug/min | INTRAVENOUS | Status: DC
  Administered 2023-10-15: 5 ug/min via INTRAVENOUS
  Filled 2023-10-15: qty 250

## 2023-10-15 MED ORDER — SODIUM CHLORIDE 0.9 % WEIGHT BASED INFUSION
3.0000 mL/kg/h | INTRAVENOUS | Status: DC
  Administered 2023-10-16: 3 mL/kg/h via INTRAVENOUS

## 2023-10-15 MED ORDER — SODIUM CHLORIDE 0.9 % WEIGHT BASED INFUSION
1.0000 mL/kg/h | INTRAVENOUS | Status: DC
  Administered 2023-10-16: 1 mL/kg/h via INTRAVENOUS

## 2023-10-15 MED ORDER — ASPIRIN 81 MG PO CHEW
81.0000 mg | CHEWABLE_TABLET | ORAL | Status: AC
  Administered 2023-10-16: 81 mg via ORAL
  Filled 2023-10-15: qty 1

## 2023-10-15 MED ORDER — ORAL CARE MOUTH RINSE: 15.0000 mL | OROMUCOSAL | Status: DC | PRN

## 2023-10-15 NOTE — ED Notes (Signed)
ED TO INPATIENT HANDOFF REPORT  ED Nurse Name and Phone #: Lennette Fader, RN [phone]  S Name/Age/Gender [name] 77 y.o. female Room/Bed: 009C/009C  Code Status   Code Status: Full Code  Home/SNF/Other Home Patient oriented to: self, place, time, and situation Is this baseline? Yes   Triage Complete: Triage complete  Chief Complaint Unstable angina (HCC) [I20.0]  Triage Note Pt arrives via POV from home, c/o midsternal CP, pt diaphretic, took 2 nitroglycerin SL, PTA. Pt had a period of unresponsiveness, blank stare unable to answer questions, possible vagal down, unable to palpate pulse. Came to on arrival to Trauma room with possible code activation.    Allergies No Known Allergies  Level of Care/Admitting Diagnosis ED Disposition     ED Disposition  Admit   Condition  --   Comment  Hospital [facility]: [facility] [[id number]]  Level of Care: Progressive [102]  Admit to Progressive based on following criteria: CARDIOVASCULAR & THORACIC of moderate stability with acute coronary syndrome symptoms/low risk myocardial infarction/hypertensive urgency/arrhythmias/heart failure potentially compromising stability and stable post cardiovascular intervention patients.  May admit patient to [facility] or [facility] if equivalent level of care is available:: No  Covid Evaluation: Asymptomatic - no recent exposure (last 10 days) testing not required  Diagnosis: Unstable angina [facility]) [[id number]]  Admitting Physician: Dolores Patty [[id number]]  Attending Physician: Wendall Stade [phone]  Certification:: I certify this patient will need inpatient services for at least 2 midnights  Expected Medical Readiness: 10/17/2023          B Medical/Surgery History Past Medical History:  Diagnosis Date   Coronary artery disease    DJD (degenerative joint disease), cervical    followed by Dr. Darlin Coco planned for 2011   Family history of colon cancer    Hypertension     Stented coronary artery    Past Surgical History:  Procedure Laterality Date   CARDIAC CATHETERIZATION  8/12   sten placement-Dr. Eldridge Dace   COLONOSCOPY WITH PROPOFOL N/A 11/22/2016   Procedure: COLONOSCOPY WITH PROPOFOL;  Surgeon: Charolett Bumpers, MD;  Location: [facility];  Service: Endoscopy;  Laterality: N/A;     A IV Location/Drains/Wounds Patient Lines/Drains/Airways Status     Active Line/Drains/Airways     Name Placement date Placement time Site Days   Peripheral IV 10/14/23 18 G Anterior;Left;Proximal Forearm 10/14/23  0315  Forearm  1   Peripheral IV 10/14/23 Right Antecubital 10/14/23  0306  Antecubital  1   Airway 11/22/16  1050  -- 2518            Intake/Output Last 24 hours No intake or output data in the 24 hours ending 10/15/23 1451  Labs/Imaging Results for orders placed or performed during the hospital encounter of 10/14/23 (from the past 48 hour(s))  Comprehensive metabolic panel     Status: Abnormal   Collection Time: 10/14/23  3:10 PM  Result Value Ref Range   Sodium 131 (L) 135 - 145 mmol/L   Potassium 4.1 3.5 - 5.1 mmol/L    Comment: HEMOLYSIS AT THIS LEVEL MAY AFFECT RESULT   Chloride 99 98 - 111 mmol/L   CO2 19 (L) 22 - 32 mmol/L   Glucose, Bld 125 (H) 70 - 99 mg/dL    Comment: Glucose reference range applies only to samples taken after fasting for at least 8 hours.   BUN 12 8 - 23 mg/dL   Creatinine, Ser 5.95 0.44 - 1.00 mg/dL   Calcium  9.2 8.9 - 10.3 mg/dL   Total Protein 6.6 6.5 - 8.1 g/dL   Albumin 3.9 3.5 - 5.0 g/dL   AST 26 15 - 41 U/L    Comment: HEMOLYSIS AT THIS LEVEL MAY AFFECT RESULT   ALT 16 0 - 44 U/L    Comment: HEMOLYSIS AT THIS LEVEL MAY AFFECT RESULT   Alkaline Phosphatase 62 38 - 126 U/L   Total Bilirubin 0.9 <1.2 mg/dL    Comment: HEMOLYSIS AT THIS LEVEL MAY AFFECT RESULT   GFR, Estimated 59 (L) >60 mL/min    Comment: (NOTE) Calculated using the CKD-EPI Creatinine Equation (2021)    Anion gap 13 5 - 15     Comment: Performed at [facility], [address] [place], [place] [address]  CBC     Status: Abnormal   Collection Time: 10/14/23  3:10 PM  Result Value Ref Range   WBC 7.5 4.0 - 10.5 K/uL   RBC 4.01 3.87 - 5.11 MIL/uL   Hemoglobin 12.1 12.0 - 15.0 g/dL   HCT 19.1 (L) 47.8 - 29.5 %   MCV 87.5 80.0 - 100.0 fL   MCH 30.2 26.0 - 34.0 pg   MCHC 34.5 30.0 - 36.0 g/dL   RDW 62.1 30.8 - 65.7 %   Platelets 315 150 - 400 K/uL   nRBC 0.0 0.0 - 0.2 %    Comment: Performed at [facility], [address], [place], [place] [address]  Troponin I (High Sensitivity)     Status: None   Collection Time: 10/14/23  3:10 PM  Result Value Ref Range   Troponin I (High Sensitivity) 4 <18 ng/L    Comment: (NOTE) Elevated high sensitivity troponin I (hsTnI) values and significant  changes across serial measurements may suggest ACS but many other  chronic and acute conditions are known to elevate hsTnI results.  Refer to the "Links" section for chest pain algorithms and additional  guidance. Performed at [facility] Lab, [address] [place], [place] [address]   Magnesium     Status: None   Collection Time: 10/14/23  3:10 PM  Result Value Ref Range   Magnesium 2.0 1.7 - 2.4 mg/dL    Comment: HEMOLYSIS AT THIS LEVEL MAY AFFECT RESULT Performed at [facility] Lab, [address] [place], [place] [address]   Troponin I (High Sensitivity)     Status: Abnormal   Collection Time: 10/14/23  5:14 PM  Result Value Ref Range   Troponin I (High Sensitivity) 23 (H) <18 ng/L    Comment: READ BACK AND VERIFIED WITH S. Ward RN , @1756 , 10/14/23 , [place], T. DELTA CHECK NOTED (NOTE) Elevated high sensitivity troponin I (hsTnI) values and significant  changes across serial measurements may suggest ACS but many other  chronic and acute conditions are known to elevate hsTnI results.  Refer to the "Links" section for chest pain algorithms and additional  guidance. Performed at [facility], [address] [place], [place] [address]   Troponin I (High Sensitivity)     Status: Abnormal   Collection Time: 10/14/23  5:56 PM  Result Value Ref Range   Troponin I (High Sensitivity) 34 (H) <18 ng/L    Comment: (NOTE) Elevated high sensitivity troponin I (hsTnI) values and significant  changes across serial measurements may suggest ACS but many other  chronic and acute conditions are known to elevate hsTnI results.  Refer to the "Links" section for chest pain algorithms  and additional  guidance. Performed at [facility], [address] [place], [place] [address]   Heparin level (unfractionated)     Status: None   Collection Time: 10/15/23  2:01 AM  Result Value Ref Range   Heparin Unfractionated 0.53 0.30 - 0.70 IU/mL    Comment: (NOTE) The clinical reportable range upper limit is being lowered to >1.10 to align with the FDA approved guidance for the current laboratory assay.  If heparin results are below expected values, and patient dosage has  been confirmed, suggest follow up testing of antithrombin III levels. Performed at [facility], [address] [place], [place] [address]   Lipid panel     Status: None   Collection Time: 10/15/23  2:01 AM  Result Value Ref Range   Cholesterol 125 0 - 200 mg/dL   Triglycerides 53 <098 mg/dL   HDL 73 >11 mg/dL   Total CHOL/HDL Ratio 1.7 RATIO   VLDL 11 0 - 40 mg/dL   LDL Cholesterol 41 0 - 99 mg/dL    Comment:        Total Cholesterol/HDL:CHD Risk Coronary Heart Disease Risk Table                     Men   Women  1/2 Average Risk   3.4   3.3  Average Risk       5.0   4.4  2 X Average Risk   9.6   7.1  3 X Average Risk  23.4   11.0        Use the calculated Patient Ratio above and the CHD Risk Table to determine the patient's CHD Risk.        ATP III CLASSIFICATION (LDL):  <100     mg/dL   Optimal  914-782  mg/dL   Near or Above                    Optimal  130-159  mg/dL   Borderline   956-213  mg/dL   High  >086     mg/dL   Very High Performed at [facility], [address] [place], [place] [address]   Basic metabolic panel     Status: Abnormal   Collection Time: 10/15/23  2:01 AM  Result Value Ref Range   Sodium 132 (L) 135 - 145 mmol/L   Potassium 3.9 3.5 - 5.1 mmol/L   Chloride 101 98 - 111 mmol/L   CO2 22 22 - 32 mmol/L   Glucose, Bld 111 (H) 70 - 99 mg/dL    Comment: Glucose reference range applies only to samples taken after fasting for at least 8 hours.   BUN 10 8 - 23 mg/dL   Creatinine, Ser 9.62 0.44 - 1.00 mg/dL   Calcium 8.8 (L) 8.9 - 10.3 mg/dL   GFR, Estimated >95 >28 mL/min    Comment: (NOTE) Calculated using the CKD-EPI Creatinine Equation (2021)    Anion gap 9 5 - 15    Comment: Performed at [facility] Lab, [address], [place], [place] [address]  CBC     Status: Abnormal   Collection Time: 10/15/23  2:01 AM  Result Value Ref Range   WBC 7.8 4.0 - 10.5 K/uL   RBC 3.57 (L) 3.87 - 5.11 MIL/uL   Hemoglobin 10.7 (L) 12.0 - 15.0 g/dL   HCT 40.1 (L) 02.7 - 25.3 %   MCV 87.7 80.0 -  100.0 fL   MCH 30.0 26.0 - 34.0 pg   MCHC 34.2 30.0 - 36.0 g/dL   RDW 25.3 66.4 - 40.3 %   Platelets 244 150 - 400 K/uL   nRBC 0.0 0.0 - 0.2 %    Comment: Performed at [facility] Lab, [address] [place], [place] [address]  Troponin I (High Sensitivity)     Status: Abnormal   Collection Time: 10/15/23  2:01 AM  Result Value Ref Range   Troponin I (High Sensitivity) 22 (H) <18 ng/L    Comment: (NOTE) Elevated high sensitivity troponin I (hsTnI) values and significant  changes across serial measurements may suggest ACS but many other  chronic and acute conditions are known to elevate hsTnI results.  Refer to the "Links" section for chest pain algorithms and additional  guidance. Performed at [facility] Lab, [address] [place], [place] [address]   Heparin level (unfractionated)     Status: None   Collection Time: 10/15/23 11:13 AM   Result Value Ref Range   Heparin Unfractionated 0.51 0.30 - 0.70 IU/mL    Comment: (NOTE) The clinical reportable range upper limit is being lowered to >1.10 to align with the FDA approved guidance for the current laboratory assay.  If heparin results are below expected values, and patient dosage has  been confirmed, suggest follow up testing of antithrombin III levels. Performed at [facility] Lab, [address] [place], [place] [address]    DG Chest Portable 1 View  Result Date: 10/14/2023 CLINICAL DATA:  Chest pain. EXAM: PORTABLE CHEST 1 VIEW COMPARISON:  10/08/2023 and older studies. FINDINGS: Cardiac silhouette is normal in size and configuration. Normal mediastinal and hilar contours. Clear lungs.  No pleural effusion or pneumothorax. Skeletal structures are grossly intact. IMPRESSION: No active disease. Electronically Signed   By: Amie Portland M.D.   On: 10/14/2023 15:23    Pending Labs Unresulted Labs (From admission, onward)     Start     Ordered   10/16/23 0500  Heparin level (unfractionated)  Daily,   R      10/14/23 1741   10/15/23 0500  Lipoprotein A (LPA)  Tomorrow morning,   R        10/14/23 1755            Vitals/Pain Today's Vitals   10/15/23 0830 10/15/23 1100 10/15/23 1257 10/15/23 1316  BP: (!) 138/59 (!) 154/68    Pulse: 69 68    Resp: 16 20    Temp: 98.2 F (36.8 C)  98.2 F (36.8 C)   TempSrc: Oral  Oral   SpO2: 100% 100%    Weight:      Height:      PainSc: 0-No pain   2     Isolation Precautions No active isolations  Medications Medications  heparin ADULT infusion 100 units/mL (25000 units/271mL) (750 Units/hr Intravenous New Bag/Given 10/14/23 1747)  rosuvastatin (CRESTOR) tablet 20 mg (20 mg Oral Given 10/15/23 1021)  pantoprazole (PROTONIX) EC tablet 20 mg (20 mg Oral Given 10/15/23 1021)  clopidogrel (PLAVIX) tablet 75 mg (75 mg Oral Given 10/15/23 1021)  multivitamin with minerals tablet 1 tablet (0 tablets Oral Hold 10/14/23  2110)  aspirin EC tablet 81 mg (81 mg Oral Given 10/15/23 1021)  acetaminophen (TYLENOL) tablet 650 mg (650 mg Oral Given 10/14/23 2114)  ondansetron (ZOFRAN) injection 4 mg (has no administration in time range)  nitroGLYCERIN 50 mg in dextrose 5 % 250 mL (  0.2 mg/mL) infusion (5 mcg/min Intravenous New Bag/Given 10/15/23 0908)  sodium chloride 0.9 % bolus 1,000 mL (1,000 mLs Intravenous Bolus 10/14/23 1524)  fentaNYL (SUBLIMAZE) injection 25 mcg (25 mcg Intravenous Given 10/14/23 1525)  aspirin chewable tablet 324 mg (324 mg Oral Given 10/14/23 1641)  fentaNYL (SUBLIMAZE) injection 50 mcg (50 mcg Intravenous Given 10/15/23 0255)  morphine (PF) 4 MG/ML injection 4 mg (4 mg Intravenous Given 10/14/23 1641)  metoprolol tartrate (LOPRESSOR) injection 2.5 mg (2.5 mg Intravenous Given 10/14/23 1748)  heparin bolus via infusion 3,500 Units (3,500 Units Intravenous Bolus from Bag 10/14/23 1747)    Mobility walks     Focused Assessments    R Recommendations: See Admitting Provider Note  Report given to:   Additional Notes: Patient is A&Ox4, ambulatory to the restroom with assistance getting unhooked. Patient is chest pain free at this time, on heparin and nitroglycerin. BP is stable and patient is comfortable.

## 2023-10-15 NOTE — Progress Notes (Signed)
ANTICOAGULATION CONSULT NOTE - Follow-up Note  Pharmacy Consult for Heparin Indication: chest pain/ACS  No Known Allergies  Patient Measurements: Height: 5\' 2"  (157.5 cm) Weight: 64.9 kg (143 lb 1.3 oz) IBW/kg (Calculated) : 50.1 Heparin Dosing Weight: 63.3 kg  Vital Signs: Temp: 98.2 F (36.8 C) (12/08 0830) Temp Source: Oral (12/08 0830) BP: 154/68 (12/08 1100) Pulse Rate: 68 (12/08 1100)  Labs: Recent Labs    10/14/23 1510 10/14/23 1714 10/14/23 1756 10/15/23 0201  HGB 12.1  --   --  10.7*  HCT 35.1*  --   --  31.3*  PLT 315  --   --  244  HEPARINUNFRC  --   --   --  0.53  CREATININE 1.00  --   --  0.90  TROPONINIHS 4 23* 34* 22*    Estimated Creatinine Clearance: 47.7 mL/min (by C-G formula based on SCr of 0.9 mg/dL).   Medical History: Past Medical History:  Diagnosis Date   Coronary artery disease    DJD (degenerative joint disease), cervical    followed by Dr. Darlin Coco planned for 2011   Family history of colon cancer    Hypertension    Stented coronary artery     Medications:  (Not in a hospital admission)  Scheduled:   aspirin EC  81 mg Oral Daily   clopidogrel  75 mg Oral Daily   multivitamin with minerals  1 tablet Oral QHS   pantoprazole  20 mg Oral Daily   rosuvastatin  20 mg Oral Daily   Infusions:   heparin 750 Units/hr (10/14/23 1747)   nitroGLYCERIN 5 mcg/min (10/15/23 0908)   PRN: acetaminophen, ondansetron (ZOFRAN) IV  Assessment: 77 yof with a history of HTN, CAD s/p PCI, HLD. Patient is presenting with chest pain and syncope. Heparin per pharmacy consult placed for chest pain/ACS.   Patient is not on anticoagulation prior to arrival. Started on 750 units/hr IV heparin following 3500 unit IV heparin bolus. Resulting heparin level was 0.53 which was therapeutic. Infusion continued at same rate.  Confirmatory level is 0.51 which is therapeutic.  No issues with infusion or bleeding per RN.  Hgb 12.1>10.7; plt  315>244  Goal of Therapy:  Heparin level 0.3-0.7 units/ml Monitor platelets by anticoagulation protocol: Yes   Plan:  Continue heparin infusion at 750 units/hr Daily heparin level Continue to monitor H&H and platelets  Delmar Landau, PharmD, BCPS 10/15/2023 11:56 AM ED Clinical Pharmacist -  [phone]

## 2023-10-15 NOTE — Progress Notes (Addendum)
   Patient Name: [name] Date of Encounter: 10/15/2023 [facility] Cardiologist: None   Interval Summary  .    No acute overnight events. Still some low grade chest discomfort, relieved by morphine and tylenol.   Vital Signs .    Vitals:   10/15/23 0500 10/15/23 0530 10/15/23 0600 10/15/23 0630  BP: 131/69 (!) 130/56 133/60 (!) 146/56  Pulse: 60 (!) 55 (!) 54 60  Resp: 15 13 13 13   Temp:      TempSrc:      SpO2: 100% 99% 100% 100%  Weight:      Height:       No intake or output data in the 24 hours ending 10/15/23 0816    10/14/2023    5:39 PM 10/10/2023    8:26 AM 10/08/2023    1:34 PM  Last 3 Weights  Weight (lbs) 143 lb 1.3 oz 143 lb 143 lb  Weight (kg) 64.9 kg 64.864 kg 64.864 kg      Telemetry/ECG    Sinus rhythm - Personally Reviewed  Physical Exam .   GEN: No acute distress.   Neck: No JVD Cardiac: Normal rate and regular rhythm. L chest tender to palpation. Respiratory: Normal work of breathing.  MS: No edema  Assessment & Plan .     [name] is a 77 y.o. female with HTN, HL, CAD s/p LAD stent in 2013 who is being seen 10/14/2023 for the evaluation of chest pain. Found to have NSTEMI with low level troponin positive.   #. NSTEMI: Tropinin 4 -> 23 -> 34 -> 22 #. CAD s/p LAD PCI in 2013 - continue aspirin  - continue Plavix - continue statin - continue heparin infusion - start IV NTG for ongoing low level (2-3/10) chest pain - LHC tomorrow - ordered echo  #. HTN - hold home anti-hypertensives to give BP room for IV NTG  #. HLD  - continue statin   For questions or updates, please contact [facility] Please consult [email or web address] for contact info under        Signed, Nobie Putnam, MD

## 2023-10-15 NOTE — Progress Notes (Signed)
ANTICOAGULATION CONSULT NOTE - Initial Consult  Pharmacy Consult for Heparin Indication: chest pain/ACS  No Known Allergies  Patient Measurements: Height: 5\' 2"  (157.5 cm) Weight: 64.9 kg (143 lb 1.3 oz) IBW/kg (Calculated) : 50.1 Heparin Dosing Weight: 63.3 kg  Vital Signs: Temp: 97.8 F (36.6 C) (12/07 1904) Temp Source: Temporal (12/07 1904) BP: 147/64 (12/08 0200) Pulse Rate: 68 (12/08 0200)  Labs: Recent Labs    10/14/23 1510 10/14/23 1714 10/14/23 1756 10/15/23 0201  HGB 12.1  --   --  10.7*  HCT 35.1*  --   --  31.3*  PLT 315  --   --  244  HEPARINUNFRC  --   --   --  0.53  CREATININE 1.00  --   --  0.90  TROPONINIHS 4 23* 34*  --     Estimated Creatinine Clearance: 47.7 mL/min (by C-G formula based on SCr of 0.9 mg/dL).   Medical History: Past Medical History:  Diagnosis Date   Coronary artery disease    DJD (degenerative joint disease), cervical    followed by Dr. Darlin Coco planned for 2011   Family history of colon cancer    Hypertension    Stented coronary artery     Medications:  (Not in a hospital admission)  Scheduled:   aspirin EC  81 mg Oral Daily   clopidogrel  75 mg Oral Daily   fentaNYL (SUBLIMAZE) injection  50 mcg Intravenous Once   lisinopril  20 mg Oral Daily   multivitamin with minerals  1 tablet Oral QHS   pantoprazole  20 mg Oral Daily   rosuvastatin  20 mg Oral Daily   Infusions:   heparin 750 Units/hr (10/14/23 1747)   PRN: acetaminophen, nitroGLYCERIN, ondansetron (ZOFRAN) IV  Assessment: 77 yof with a history of HTN, CAD s/p PCI, HLD. Patient is presenting with chest pain and syncope. Heparin per pharmacy consult placed for chest pain/ACS.  Patient is not on anticoagulation prior to arrival.  12/8 AM: heparin level 0.53 on 750 units/hr (therapeutic). Per RN, no issues with the heparin infusion running continuously or signs/symptoms of bleeding. Hgb 12>10.7, plts 315>244  Goal of Therapy:  Heparin level  0.3-0.7 units/ml Monitor platelets by anticoagulation protocol: Yes   Plan:  Continue heparin infusion at 750 units/hr Check confirmatory anti-Xa level in 8 hours and daily while on heparin Continue to monitor H&H and platelets  Arabella Merles, PharmD. Clinical Pharmacist 10/15/2023 2:41 AM

## 2023-10-15 NOTE — Plan of Care (Signed)
  Problem: Education: Goal: Knowledge of General Education information will improve Description: Including pain rating scale, medication(s)/side effects and non-pharmacologic comfort measures Outcome: Progressing   Problem: Pain Management: Goal: General experience of comfort will improve Outcome: Progressing

## 2023-10-16 ENCOUNTER — Inpatient Hospital Stay (HOSPITAL_COMMUNITY): Payer: Medicare HMO

## 2023-10-16 ENCOUNTER — Encounter (HOSPITAL_COMMUNITY)
Admission: EM | Disposition: A | Discharge status: Home / Self Care | Payer: Self-pay | Source: Home / Self Care | Attending: Cardiovascular Disease

## 2023-10-16 ENCOUNTER — Other Ambulatory Visit: Payer: Self-pay

## 2023-10-16 DIAGNOSIS — I214 Non-ST elevation (NSTEMI) myocardial infarction: Secondary | ICD-10-CM | POA: Diagnosis not present

## 2023-10-16 DIAGNOSIS — I10 Essential (primary) hypertension: Secondary | ICD-10-CM | POA: Diagnosis not present

## 2023-10-16 DIAGNOSIS — I2510 Atherosclerotic heart disease of native coronary artery without angina pectoris: Secondary | ICD-10-CM | POA: Diagnosis not present

## 2023-10-16 HISTORY — PX: CORONARY PRESSURE/FFR STUDY: CATH118243

## 2023-10-16 HISTORY — PX: LEFT HEART CATH AND CORONARY ANGIOGRAPHY: CATH118249

## 2023-10-16 LAB — ECHOCARDIOGRAM COMPLETE
Area-P 1/2: 2.57 cm2
Calc EF: 61.8 %
Height: 62 in
MV VTI: 3.15 cm2
S' Lateral: 2.7 cm
Single Plane A2C EF: 59.8 %
Single Plane A4C EF: 66.7 %
Weight: 2289.26 [oz_av]

## 2023-10-16 LAB — BASIC METABOLIC PANEL
Anion gap: 8 (ref 5–15)
BUN: 11 mg/dL (ref 8–23)
CO2: 24 mmol/L (ref 22–32)
Calcium: 8.9 mg/dL (ref 8.9–10.3)
Chloride: 102 mmol/L (ref 98–111)
Creatinine, Ser: 0.92 mg/dL (ref 0.44–1.00)
GFR, Estimated: >60 mL/min (ref >60)
Glucose, Bld: 107 mg/dL — ABNORMAL HIGH (ref 70–99)
Potassium: 3.9 mmol/L (ref 3.5–5.1)
Sodium: 134 mmol/L — ABNORMAL LOW (ref 135–145)

## 2023-10-16 LAB — CBC
HCT: 34.4 % — ABNORMAL LOW (ref 36.0–46.0)
Hemoglobin: 11.8 g/dL — ABNORMAL LOW (ref 12.0–15.0)
MCH: 30.2 pg (ref 26.0–34.0)
MCHC: 34.3 g/dL (ref 30.0–36.0)
MCV: 88 fL (ref 80.0–100.0)
Platelets: 245 10*3/uL (ref 150–400)
RBC: 3.91 MIL/uL (ref 3.87–5.11)
RDW: 12.4 % (ref 11.5–15.5)
WBC: 6.7 10*3/uL (ref 4.0–10.5)
nRBC: 0 % (ref 0.0–0.2)

## 2023-10-16 LAB — POCT ACTIVATED CLOTTING TIME: Activated Clotting Time: 389 s

## 2023-10-16 LAB — HEPARIN LEVEL (UNFRACTIONATED)
Heparin Unfractionated: 1 [IU]/mL — ABNORMAL HIGH (ref 0.30–0.70)
Heparin Unfractionated: 1.02 [IU]/mL — ABNORMAL HIGH (ref 0.30–0.70)

## 2023-10-16 LAB — LIPOPROTEIN A (LPA): Lipoprotein (a): 69 nmol/L — ABNORMAL HIGH (ref <75.0)

## 2023-10-16 SURGERY — LEFT HEART CATH AND CORONARY ANGIOGRAPHY
Anesthesia: LOCAL

## 2023-10-16 MED ORDER — MIDAZOLAM HCL 2 MG/2ML IJ SOLN
INTRAMUSCULAR | Status: AC
  Filled 2023-10-16: qty 2

## 2023-10-16 MED ORDER — PERFLUTREN LIPID MICROSPHERE
1.0000 mL | INTRAVENOUS | Status: AC | PRN
  Administered 2023-10-16: 2 mL via INTRAVENOUS

## 2023-10-16 MED ORDER — VERAPAMIL HCL 2.5 MG/ML IV SOLN
INTRAVENOUS | Status: AC
  Filled 2023-10-16: qty 2

## 2023-10-16 MED ORDER — IOHEXOL 350 MG/ML SOLN
INTRAVENOUS | Status: DC | PRN
  Administered 2023-10-16: 100 mL

## 2023-10-16 MED ORDER — LISINOPRIL 20 MG PO TABS
20.0000 mg | ORAL_TABLET | Freq: Every day | ORAL | Status: DC
  Administered 2023-10-16 – 2023-10-17 (×2): 20 mg via ORAL
  Filled 2023-10-16 (×2): qty 1

## 2023-10-16 MED ORDER — HEPARIN SODIUM (PORCINE) 1000 UNIT/ML IJ SOLN
INTRAMUSCULAR | Status: AC
  Filled 2023-10-16: qty 10

## 2023-10-16 MED ORDER — LABETALOL HCL 5 MG/ML IV SOLN: 10.0000 mg | INTRAVENOUS | Status: AC | PRN

## 2023-10-16 MED ORDER — ADENOSINE (DIAGNOSTIC) 140MCG/KG/MIN
INTRAVENOUS | Status: AC | PRN
  Administered 2023-10-16: 140 ug/kg/min via INTRAVENOUS

## 2023-10-16 MED ORDER — SODIUM CHLORIDE 0.9% FLUSH: 3.0000 mL | INTRAVENOUS | Status: DC | PRN

## 2023-10-16 MED ORDER — FUROSEMIDE 10 MG/ML IJ SOLN
INTRAMUSCULAR | Status: AC
  Filled 2023-10-16: qty 4

## 2023-10-16 MED ORDER — HEPARIN SODIUM (PORCINE) 1000 UNIT/ML IJ SOLN
INTRAMUSCULAR | Status: DC | PRN
  Administered 2023-10-16: 5000 [IU] via INTRA_ARTERIAL
  Administered 2023-10-16: 2000 [IU] via INTRA_ARTERIAL

## 2023-10-16 MED ORDER — FENTANYL CITRATE (PF) 100 MCG/2ML IJ SOLN
INTRAMUSCULAR | Status: DC | PRN
  Administered 2023-10-16 (×3): 25 ug via INTRAVENOUS

## 2023-10-16 MED ORDER — LORAZEPAM 1 MG PO TABS
1.0000 mg | ORAL_TABLET | Freq: Four times a day (QID) | ORAL | Status: DC | PRN
Start: 2023-10-16 — End: 2023-10-17
  Administered 2023-10-16: 1 mg via ORAL
  Filled 2023-10-16: qty 1

## 2023-10-16 MED ORDER — HYDRALAZINE HCL 20 MG/ML IJ SOLN: 10.0000 mg | INTRAMUSCULAR | Status: AC | PRN

## 2023-10-16 MED ORDER — LIDOCAINE HCL (PF) 1 % IJ SOLN
INTRAMUSCULAR | Status: DC | PRN
  Administered 2023-10-16: 2 mL via INTRADERMAL

## 2023-10-16 MED ORDER — ADENOSINE 12 MG/4ML IV SOLN
INTRAVENOUS | Status: AC
  Filled 2023-10-16: qty 16

## 2023-10-16 MED ORDER — HEPARIN (PORCINE) IN NACL 1000-0.9 UT/500ML-% IV SOLN
INTRAVENOUS | Status: DC | PRN
  Administered 2023-10-16 (×2): 500 mL

## 2023-10-16 MED ORDER — SODIUM CHLORIDE 0.9% FLUSH
3.0000 mL | Freq: Two times a day (BID) | INTRAVENOUS | Status: DC
  Administered 2023-10-16 – 2023-10-17 (×2): 3 mL via INTRAVENOUS

## 2023-10-16 MED ORDER — FUROSEMIDE 10 MG/ML IJ SOLN
INTRAMUSCULAR | Status: DC | PRN
  Administered 2023-10-16: 20 mg via INTRAVENOUS

## 2023-10-16 MED ORDER — HEPARIN (PORCINE) 25000 UT/250ML-% IV SOLN: 600.0000 [IU]/h | INTRAVENOUS | Status: DC

## 2023-10-16 MED ORDER — FENTANYL CITRATE (PF) 100 MCG/2ML IJ SOLN
INTRAMUSCULAR | Status: AC
  Filled 2023-10-16: qty 2

## 2023-10-16 MED ORDER — CARVEDILOL 3.125 MG PO TABS
3.1250 mg | ORAL_TABLET | Freq: Two times a day (BID) | ORAL | Status: DC
  Administered 2023-10-16 – 2023-10-17 (×2): 3.125 mg via ORAL
  Filled 2023-10-16 (×3): qty 1

## 2023-10-16 MED ORDER — LIDOCAINE HCL (PF) 1 % IJ SOLN
INTRAMUSCULAR | Status: AC
  Filled 2023-10-16: qty 30

## 2023-10-16 MED ORDER — MIDAZOLAM HCL 2 MG/2ML IJ SOLN
INTRAMUSCULAR | Status: DC | PRN
  Administered 2023-10-16 (×2): 1 mg via INTRAVENOUS

## 2023-10-16 MED ORDER — VERAPAMIL HCL 2.5 MG/ML IV SOLN
INTRAVENOUS | Status: DC | PRN
  Administered 2023-10-16: 10 mL via INTRA_ARTERIAL

## 2023-10-16 MED ORDER — SODIUM CHLORIDE 0.9 % IV SOLN: 250.0000 mL | INTRAVENOUS | Status: DC | PRN

## 2023-10-16 SURGICAL SUPPLY — 13 items
CATH INFINITI 5FR ANG PIGTAIL (CATHETERS) IMPLANT
CATH INFINITI AMBI 6FR TG (CATHETERS) IMPLANT
CATH LAUNCHER 6FR EBU3.5 (CATHETERS) IMPLANT
DEVICE RAD TR BAND REGULAR (VASCULAR PRODUCTS) IMPLANT
GLIDESHEATH SLEND A-KIT 6F 22G (SHEATH) IMPLANT
GLIDESHEATH SLEND SS 6F .021 (SHEATH) IMPLANT
GUIDEWIRE PRESSURE X 175 (WIRE) IMPLANT
KIT ESSENTIALS PG (KITS) IMPLANT
KIT HEMO VALVE WATCHDOG (MISCELLANEOUS) IMPLANT
KIT SYRINGE INJ CVI SPIKEX1 (MISCELLANEOUS) IMPLANT
PACK CARDIAC CATHETERIZATION (CUSTOM PROCEDURE TRAY) ×1 IMPLANT
SET ATX-X65L (MISCELLANEOUS) IMPLANT
WIRE EMERALD 3MM-J .035X260CM (WIRE) IMPLANT

## 2023-10-16 NOTE — Interval H&P Note (Signed)
History and Physical Interval Note:  10/16/2023 11:53 AM  [name]  has presented today for surgery, with the diagnosis of unstable angina.  The various methods of treatment have been discussed with the patient and family. After consideration of risks, benefits and other options for treatment, the patient has consented to  Procedure(s): LEFT HEART CATH AND CORONARY ANGIOGRAPHY (N/A) as a surgical intervention.  The patient's history has been reviewed, patient examined, no change in status, stable for surgery.  I have reviewed the patient's chart and labs.  Questions were answered to the patient's satisfaction.     [name]

## 2023-10-16 NOTE — Progress Notes (Signed)
   Patient Name: [name] Date of Encounter: 10/16/2023 [facility] Cardiologist: None   Interval Summary  .    Noted some chest pain this morning with significant elevation of BP. Improved BP and chest pain now on IV Ntg.   Vital Signs .    Vitals:   10/16/23 0620 10/16/23 0630 10/16/23 0700 10/16/23 0817  BP: (!) 167/66 (!) 182/76 (!) 186/75 (!) 97/57  Pulse: 71 61 72 62  Resp: 14 14 13 15   Temp:      TempSrc:      SpO2: 96%  98% 94%  Weight:      Height:        Intake/Output Summary (Last 24 hours) at 10/16/2023 1014 Last data filed at 10/16/2023 0600 Gross per 24 hour  Intake 802.44 ml  Output 1975 ml  Net -1172.56 ml      10/15/2023    4:34 PM 10/14/2023    5:39 PM 10/10/2023    8:26 AM  Last 3 Weights  Weight (lbs) 143 lb 1.3 oz 143 lb 1.3 oz 143 lb  Weight (kg) 64.9 kg 64.9 kg 64.864 kg      Telemetry/ECG    Sinus rhythm - Personally Reviewed  Physical Exam .   GEN: No acute distress.   Neck: No JVD Cardiac: Normal rate and regular rhythm. No murmur or gallop. Excellent pulses.  Respiratory: Normal work of breathing.  MS: No edema  Assessment & Plan .     [name] is a 77 y.o. female with HTN, HL, CAD s/p LAD stent in August 2012 with 3.0 x 16 mm Promus stent who is being seen 10/14/2023 for the evaluation of chest pain.  Found to have NSTEMI with low level troponin positive.   1. NSTEMI: Tropinin 4 -> 23 -> 34 -> 22 - CAD s/p LAD PCI in Aug 2012 - continue aspirin  - continue Plavix - continue statin - continue heparin infusion - continue IV NTG for ongoing low level (2-3/10) chest pain - LHC today -  echo pending  2. HTN - improved on IV Ntg - will resume lisinopril 20 mg daily.   3. HLD  - on high dose Crestor.    For questions or updates, please contact [facility] Please consult [email or web address] for contact info under        Signed, Kristin Schlotzhauer [place], MD

## 2023-10-16 NOTE — Plan of Care (Signed)
  Problem: Education: Goal: Understanding of cardiac disease, CV risk reduction, and recovery process will improve Outcome: Progressing Goal: Individualized Educational Video(s) Outcome: Progressing   Problem: Activity: Goal: Ability to tolerate increased activity will improve Outcome: Progressing   Problem: Cardiac: Goal: Ability to achieve and maintain adequate cardiovascular perfusion will improve Outcome: Progressing   Problem: Health Behavior/Discharge Planning: Goal: Ability to safely manage health-related needs after discharge will improve Outcome: Progressing   Problem: Education: Goal: Knowledge of General Education information will improve Description: Including pain rating scale, medication(s)/side effects and non-pharmacologic comfort measures Outcome: Progressing   Problem: Health Behavior/Discharge Planning: Goal: Ability to manage health-related needs will improve Outcome: Progressing   Problem: Clinical Measurements: Goal: Ability to maintain clinical measurements within normal limits will improve Outcome: Progressing Goal: Will remain free from infection Outcome: Progressing Goal: Diagnostic test results will improve Outcome: Progressing Goal: Respiratory complications will improve Outcome: Progressing Goal: Cardiovascular complication will be avoided Outcome: Progressing   Problem: Activity: Goal: Risk for activity intolerance will decrease Outcome: Progressing   Problem: Nutrition: Goal: Adequate nutrition will be maintained Outcome: Progressing   Problem: Coping: Goal: Level of anxiety will decrease Outcome: Progressing   Problem: Elimination: Goal: Will not experience complications related to bowel motility Outcome: Progressing Goal: Will not experience complications related to urinary retention Outcome: Progressing   Problem: Pain Management: Goal: General experience of comfort will improve Outcome: Progressing   Problem:  Safety: Goal: Ability to remain free from injury will improve Outcome: Progressing   Problem: Skin Integrity: Goal: Risk for impaired skin integrity will decrease Outcome: Progressing   Problem: Education: Goal: Understanding of CV disease, CV risk reduction, and recovery process will improve Outcome: Progressing Goal: Individualized Educational Video(s) Outcome: Progressing   Problem: Activity: Goal: Ability to return to baseline activity level will improve Outcome: Progressing   Problem: Cardiovascular: Goal: Ability to achieve and maintain adequate cardiovascular perfusion will improve Outcome: Progressing Goal: Vascular access site(s) Level 0-1 will be maintained Outcome: Progressing   Problem: Health Behavior/Discharge Planning: Goal: Ability to safely manage health-related needs after discharge will improve Outcome: Progressing

## 2023-10-16 NOTE — Progress Notes (Signed)
  Echocardiogram 2D Echocardiogram has been performed.  Milda Smart 10/16/2023, 11:24 AM

## 2023-10-16 NOTE — Plan of Care (Signed)
  Problem: Education: Goal: Knowledge of General Education information will improve Description: Including pain rating scale, medication(s)/side effects and non-pharmacologic comfort measures Outcome: Progressing   Problem: Health Behavior/Discharge Planning: Goal: Ability to manage health-related needs will improve Outcome: Progressing   Problem: Clinical Measurements: Goal: Ability to maintain clinical measurements within normal limits will improve Outcome: Progressing Goal: Will remain free from infection Outcome: Progressing Goal: Respiratory complications will improve Outcome: Progressing   Problem: Activity: Goal: Risk for activity intolerance will decrease Outcome: Progressing   Problem: Nutrition: Goal: Adequate nutrition will be maintained Outcome: Progressing   Problem: Pain Management: Goal: General experience of comfort will improve Outcome: Progressing   Problem: Safety: Goal: Ability to remain free from injury will improve Outcome: Progressing   Problem: Skin Integrity: Goal: Risk for impaired skin integrity will decrease Outcome: Progressing

## 2023-10-16 NOTE — TOC CM/SW Note (Signed)
Transition of Care [facility]) - Inpatient Brief Assessment   Patient Details  Name: [name] MRN: [id number] Date of Birth: October 04, 1946  [facility] of [facility]) CM/SW Contact:    Harriet Masson, RN Phone Number: 10/16/2023, 12:11 PM   Clinical Narrative:  Patient had LEFT HEART CATH AND CORONARY ANGIOGRAPHY today.  No TOC needs at this time.  Transition of Care Asessment: Insurance and Status: Insurance coverage has been reviewed Patient has primary care physician: Yes Home environment has been reviewed: safe to discharge home Prior level of function:: independent Prior/Current Home Services: No current home services Social Determinants of Health Reivew: SDOH reviewed no interventions necessary Readmission risk has been reviewed: Yes Transition of care needs: no transition of care needs at this time

## 2023-10-16 NOTE — Interval H&P Note (Signed)
History and Physical Interval Note:  10/16/2023 12:07 PM  [name]  has presented today for surgery, with the diagnosis of unstable angina.  The various methods of treatment have been discussed with the patient and family. After consideration of risks, benefits and other options for treatment, the patient has consented to  Procedure(s): LEFT HEART CATH AND CORONARY ANGIOGRAPHY (N/A) as a surgical intervention.  The patient's history has been reviewed, patient examined, no change in status, stable for surgery.  I have reviewed the patient's chart and labs.  Questions were answered to the patient's satisfaction.     [name]

## 2023-10-16 NOTE — Progress Notes (Signed)
ANTICOAGULATION CONSULT NOTE - Follow-up Note  Pharmacy Consult for Heparin Indication: chest pain/ACS  No Known Allergies  Patient Measurements: Height: 5\' 2"  (157.5 cm) Weight: 64.9 kg (143 lb 1.3 oz) IBW/kg (Calculated) : 50.1 Heparin Dosing Weight: 63.3 kg  Vital Signs: Temp: 98 F (36.7 C) (12/08 2339) Temp Source: Oral (12/08 2339) BP: 182/76 (12/09 0630) Pulse Rate: 61 (12/09 0630)  Labs: Recent Labs    10/14/23 1510 10/14/23 1510 10/14/23 1714 10/14/23 1756 10/15/23 0201 10/15/23 1113 10/16/23 0219 10/16/23 0439  HGB 12.1  --   --   --  10.7*  --  11.8*  --   HCT 35.1*  --   --   --  31.3*  --  34.4*  --   PLT 315  --   --   --  244  --  245  --   HEPARINUNFRC  --    < >  --   --  0.53 0.51 1.00* 1.02*  CREATININE 1.00  --   --   --  0.90  --  0.92  --   TROPONINIHS 4  --  23* 34* 22*  --   --   --    < > = values in this interval not displayed.    Estimated Creatinine Clearance: 46.7 mL/min (by C-G formula based on SCr of 0.92 mg/dL).  Assessment: 53 yof with a history of HTN, CAD s/p PCI, HLD. Patient is presenting with chest pain and syncope. Heparin per pharmacy consult placed for chest pain/ACS. Patient is not on anticoagulation prior to arrival. Started on 750 units/hr IV heparin following 3500 unit IV heparin bolus  12/9 AM: heparin level 1 and repeat 1.02 (drawn correctly). No issues with infusion or bleeding per RN. CBC shows Hgb 11, plts 200s  Goal of Therapy:  Heparin level 0.3-0.7 units/ml Monitor platelets by anticoagulation protocol: Yes   Plan:  Hold heparin for 1 hour Decrease heparin infusion to 600 units/hr 8h heparin level Daily heparin level Continue to monitor H&H and platelets  Arabella Merles, PharmD. Clinical Pharmacist 10/16/2023 7:05 AM

## 2023-10-16 NOTE — Progress Notes (Addendum)
Patient BP elevated this morning at 187/80 (111).  Patient already on IV nitroglycerin.  Nitroglycerin increased to 66mcg/min since patient feels like chest pain is returning. Patient states she is also feeling anxious this morning. MD paged.     0550-MD confirmed to treat BP with IV nitroglycerin.

## 2023-10-16 NOTE — H&P (View-Only) (Signed)
   Patient Name: [name] Date of Encounter: 10/16/2023 [facility] Cardiologist: None   Interval Summary  .    Noted some chest pain this morning with significant elevation of BP. Improved BP and chest pain now on IV Ntg.   Vital Signs .    Vitals:   10/16/23 0620 10/16/23 0630 10/16/23 0700 10/16/23 0817  BP: (!) 167/66 (!) 182/76 (!) 186/75 (!) 97/57  Pulse: 71 61 72 62  Resp: 14 14 13 15   Temp:      TempSrc:      SpO2: 96%  98% 94%  Weight:      Height:        Intake/Output Summary (Last 24 hours) at 10/16/2023 1014 Last data filed at 10/16/2023 0600 Gross per 24 hour  Intake 802.44 ml  Output 1975 ml  Net -1172.56 ml      10/15/2023    4:34 PM 10/14/2023    5:39 PM 10/10/2023    8:26 AM  Last 3 Weights  Weight (lbs) 143 lb 1.3 oz 143 lb 1.3 oz 143 lb  Weight (kg) 64.9 kg 64.9 kg 64.864 kg      Telemetry/ECG    Sinus rhythm - Personally Reviewed  Physical Exam .   GEN: No acute distress.   Neck: No JVD Cardiac: Normal rate and regular rhythm. No murmur or gallop. Excellent pulses.  Respiratory: Normal work of breathing.  MS: No edema  Assessment & Plan .     [name] is a 77 y.o. female with HTN, HL, CAD s/p LAD stent in August 2012 with 3.0 x 16 mm Promus stent who is being seen 10/14/2023 for the evaluation of chest pain.  Found to have NSTEMI with low level troponin positive.   1. NSTEMI: Tropinin 4 -> 23 -> 34 -> 22 - CAD s/p LAD PCI in Aug 2012 - continue aspirin  - continue Plavix - continue statin - continue heparin infusion - continue IV NTG for ongoing low level (2-3/10) chest pain - LHC today -  echo pending  2. HTN - improved on IV Ntg - will resume lisinopril 20 mg daily.   3. HLD  - on high dose Crestor.    For questions or updates, please contact [facility] Please consult [email or web address] for contact info under        Signed, Innocence Schlotzhauer [place], MD

## 2023-10-17 ENCOUNTER — Telehealth: Payer: Self-pay | Admitting: Cardiology

## 2023-10-17 ENCOUNTER — Other Ambulatory Visit (HOSPITAL_COMMUNITY): Payer: Self-pay

## 2023-10-17 ENCOUNTER — Encounter (HOSPITAL_COMMUNITY): Payer: Self-pay | Admitting: Internal Medicine

## 2023-10-17 DIAGNOSIS — I214 Non-ST elevation (NSTEMI) myocardial infarction: Secondary | ICD-10-CM | POA: Diagnosis not present

## 2023-10-17 LAB — CBC
HCT: 34.1 % — ABNORMAL LOW (ref 36.0–46.0)
Hemoglobin: 11.7 g/dL — ABNORMAL LOW (ref 12.0–15.0)
MCH: 29.7 pg (ref 26.0–34.0)
MCHC: 34.3 g/dL (ref 30.0–36.0)
MCV: 86.5 fL (ref 80.0–100.0)
Platelets: 258 10*3/uL (ref 150–400)
RBC: 3.94 MIL/uL (ref 3.87–5.11)
RDW: 12 % (ref 11.5–15.5)
WBC: 7 10*3/uL (ref 4.0–10.5)
nRBC: 0 % (ref 0.0–0.2)

## 2023-10-17 LAB — BASIC METABOLIC PANEL
Anion gap: 10 (ref 5–15)
BUN: 12 mg/dL (ref 8–23)
CO2: 25 mmol/L (ref 22–32)
Calcium: 8.8 mg/dL — ABNORMAL LOW (ref 8.9–10.3)
Chloride: 98 mmol/L (ref 98–111)
Creatinine, Ser: 0.89 mg/dL (ref 0.44–1.00)
GFR, Estimated: >60 mL/min (ref >60)
Glucose, Bld: 97 mg/dL (ref 70–99)
Potassium: 3.1 mmol/L — ABNORMAL LOW (ref 3.5–5.1)
Sodium: 133 mmol/L — ABNORMAL LOW (ref 135–145)

## 2023-10-17 MED ORDER — CARVEDILOL 3.125 MG PO TABS
3.1250 mg | ORAL_TABLET | Freq: Two times a day (BID) | ORAL | 11 refills | Status: DC
  Filled 2023-10-17: qty 30, 15d supply, fill #0

## 2023-10-17 MED ORDER — ASPIRIN 81 MG PO TBEC
81.0000 mg | DELAYED_RELEASE_TABLET | Freq: Every day | ORAL | 12 refills | Status: DC
  Filled 2023-10-17: qty 30, 30d supply, fill #0

## 2023-10-17 MED ORDER — POTASSIUM CHLORIDE CRYS ER 20 MEQ PO TBCR
40.0000 meq | EXTENDED_RELEASE_TABLET | ORAL | Status: AC
Start: 2023-10-17 — End: 2023-10-17
  Administered 2023-10-17 (×2): 40 meq via ORAL
  Filled 2023-10-17 (×2): qty 2

## 2023-10-17 NOTE — Progress Notes (Signed)
Discharge instructions reviewed with pt and her husband. Pt/husband educated on radial site care, both able to teach back. Copy of instructions given to pt. [facility] has filled her scripts and will be picked up on the way out for discharge.  Pt to be  d/c'd via wheelchair with belongings, with her husband and      escorted by hospital volunteer.   Munachimso Palin,RN SWOT

## 2023-10-17 NOTE — Discharge Summary (Signed)
Discharge Summary    Patient ID: [name] MRN: [id number]; DOB: Aug 10, 1946  Admit date: 10/14/2023 Discharge date: 10/17/2023  PCP:  Thana Ates, MD   [facility] Providers Cardiologist:  Elder Negus, MD     Discharge Diagnoses    Principal Problem:   Unstable angina [facility]) Active Problems:   Coronary artery disease   Hypertension   Mixed hyperlipidemia   Diagnostic Studies/Procedures    Cath: 10/16/2023    Ost LAD to Prox LAD lesion is 10% stenosed.   Prox LAD to Mid LAD lesion is 20% stenosed.   Ost LAD lesion is 20% stenosed.   1.  Patent proximal LAD stent with mild ostial and mid LAD disease. 2.  Evidence of coronary microvascular dysfunction with CFR of 1.1 and IMR of 25. 3.  Highly elevated LVEDP of 26 mmHg; the patient was administered 20 mg of IV Lasix.   Recommendation: Medical therapy.  Diagnostic Dominance: Co-dominant  _____________   History of Present Illness     [name] is a 77 y.o. female with PMH of HTN, Hl and CAD s/p previous LAD stent  in 2013. No cath since.   Seen in ED 12/1 with CR. W/u negative. Had f/u with Dr. Rosemary Holms on 12/3. Scheduled for stress test.    Reported her chest pain that had been waxing and waning over past several days with increasing frequency. She said was not exertional or pleuritic but acutely worsened the day of admission. She did say it felt somewhat similar when she had chest pain requiring stenting in the past. She said she tried taking nitro that she was given and took 1 dose without much relief. She then took a second 1 as she was getting here to the ED and subsequently she passed out in the waiting room. Blood pressure was initially undetectable then found to be 81 systolic. Patient rushed back to examination room for eval. Given 1L NS  with improvement in BP.     Hospital Course     NSTEMI CAD s/p LAD PCI in Aug 2012 -- hsTn 4>>23>>34>>22. Underwent cardiac cath that showed  widely patent stent  -- coronary flow analysis does show evidence of microvascular disease with high IMR and low CFR -- echo showed LVEF of 60-65%, no rWMA, normal RV, g1dd -- Recommended initiation of beta blocker with Coreg 3.125mg  BID, with continuation of ACEi, statin -- continue aspirin, plavix   HTN -- improved  -- resume lisinopril 20 mg daily, add coreg 3.125mg  BID    HLD  -- LDL 41, HDL 73, LP (a) 69 -- on high dose Crestor   Hypokalemia -- s/p IV lasix, K+ 3.1 -- supplemented prior to discharge   Patient seen by Dr. [place] and deemed stable for discharge home. Follow up arranged in the office. Medications sent to [facility] pharmacy. Educated by pharmD prior to discharge.   Did the patient have an acute coronary syndrome (MI, NSTEMI, STEMI, etc) this admission?:  Yes                               AHA/ACC ACS Clinical Performance & Quality Measures: Aspirin prescribed? - Yes ADP Receptor Inhibitor (Plavix/Clopidogrel, Brilinta/Ticagrelor or Effient/Prasugrel) prescribed (includes medically managed patients)? - Yes Beta Blocker prescribed? - Yes High Intensity Statin (Lipitor 40-80mg  or Crestor 20-40mg ) prescribed? - Yes EF assessed during THIS hospitalization? - Yes For EF <40%, was ACEI/ARB prescribed? -  Not Applicable (EF >/= 40%) For EF <40%, Aldosterone Antagonist (Spironolactone or Eplerenone) prescribed? - Not Applicable (EF >/= 40%) Cardiac Rehab Phase II ordered (including medically managed patients)? - No - likely microvascular disease, no PCI    The patient will be scheduled for a TOC follow up appointment in 10-14 days.  A message has been sent to the [facility] and Scheduling Pool at the office where the patient should be seen for follow up.  _____________  Discharge Vitals Blood pressure (!) 107/59, pulse 71, temperature 98.4 F (36.9 C), temperature source Oral, resp. rate 20, height 5\' 2"  (1.575 m), weight 64.9 kg, SpO2 95%.  Filed Weights   10/14/23 1739  10/15/23 1634  Weight: 64.9 kg 64.9 kg    Labs & Radiologic Studies    CBC Recent Labs    10/16/23 0219 10/17/23 0214  WBC 6.7 7.0  HGB 11.8* 11.7*  HCT 34.4* 34.1*  MCV 88.0 86.5  PLT 245 258   Basic Metabolic Panel Recent Labs    08/65/78 1510 10/15/23 0201 10/16/23 0219 10/17/23 0214  NA 131*   < > 134* 133*  K 4.1   < > 3.9 3.1*  CL 99   < > 102 98  CO2 19*   < > 24 25  GLUCOSE 125*   < > 107* 97  BUN 12   < > 11 12  CREATININE 1.00   < > 0.92 0.89  CALCIUM 9.2   < > 8.9 8.8*  MG 2.0  --   --   --    < > = values in this interval not displayed.   Liver Function Tests Recent Labs    10/14/23 1510  AST 26  ALT 16  ALKPHOS 62  BILITOT 0.9  PROT 6.6  ALBUMIN 3.9   No results for input(s): "LIPASE", "AMYLASE" in the last 72 hours. High Sensitivity Troponin:   Recent Labs  Lab 10/08/23 1532 10/14/23 1510 10/14/23 1714 10/14/23 1756 10/15/23 0201  TROPONINIHS 8 4 23* 34* 22*    BNP Invalid input(s): "POCBNP" D-Dimer No results for input(s): "DDIMER" in the last 72 hours. Hemoglobin A1C No results for input(s): "HGBA1C" in the last 72 hours. Fasting Lipid Panel Recent Labs    10/15/23 0201  CHOL 125  HDL 73  LDLCALC 41  TRIG 53  CHOLHDL 1.7   Thyroid Function Tests No results for input(s): "TSH", "T4TOTAL", "T3FREE", "THYROIDAB" in the last 72 hours.  Invalid input(s): "FREET3" _____________  CARDIAC CATHETERIZATION  Result Date: 10/16/2023   Ost LAD to Prox LAD lesion is 10% stenosed.   Prox LAD to Mid LAD lesion is 20% stenosed.   Ost LAD lesion is 20% stenosed. 1.  Patent proximal LAD stent with mild ostial and mid LAD disease. 2.  Evidence of coronary microvascular dysfunction with CFR of 1.1 and IMR of 25. 3.  Highly elevated LVEDP of 26 mmHg; the patient was administered 20 mg of IV Lasix. Recommendation: Medical therapy.   ECHOCARDIOGRAM COMPLETE  Result Date: 10/16/2023    ECHOCARDIOGRAM REPORT   Patient Name:   [name]  Date of Exam: 10/16/2023 Medical Rec #:  [id number]     Height:       62.0 in Accession #:    [id number]    Weight:       143.1 lb Date of Birth:  01/04/1946    BSA:          1.658 m Patient Age:  77 years      BP:           146/73 mmHg Patient Gender: F             HR:           67 bpm. Exam Location:  Inpatient Procedure: 2D Echo, Cardiac Doppler, Color Doppler and Intracardiac            Opacification Agent Indications:    NSTEMI  History:        Patient has no prior history of Echocardiogram examinations.                 CAD; Risk Factors:Hypertension.  Sonographer:    Milda Smart Referring Phys: [id number] AYTKZS PARKER  Sonographer Comments: Image acquisition challenging due to respiratory motion. IMPRESSIONS  1. Left ventricular ejection fraction, by estimation, is 60 to 65%. The left ventricle has normal function. The left ventricle has no regional wall motion abnormalities. Left ventricular diastolic parameters are consistent with Grade I diastolic dysfunction (impaired relaxation).  2. Right ventricular systolic function is normal. The right ventricular size is normal.  3. The mitral valve is abnormal. Trivial mitral valve regurgitation. No evidence of mitral stenosis.  4. The aortic valve is tricuspid. There is moderate calcification of the aortic valve. There is moderate thickening of the aortic valve. Aortic valve regurgitation is mild. Aortic valve sclerosis is present, with no evidence of aortic valve stenosis.  5. The inferior vena cava is normal in size with greater than 50% respiratory variability, suggesting right atrial pressure of 3 mmHg. FINDINGS  Left Ventricle: Left ventricular ejection fraction, by estimation, is 60 to 65%. The left ventricle has normal function. The left ventricle has no regional wall motion abnormalities. Definity contrast agent was given IV to delineate the left ventricular  endocardial borders. The left ventricular internal cavity size was normal in size. There is no  left ventricular hypertrophy. Left ventricular diastolic parameters are consistent with Grade I diastolic dysfunction (impaired relaxation). Right Ventricle: The right ventricular size is normal. No increase in right ventricular wall thickness. Right ventricular systolic function is normal. Left Atrium: Left atrial size was normal in size. Right Atrium: Right atrial size was normal in size. Pericardium: There is no evidence of pericardial effusion. Mitral Valve: The mitral valve is abnormal. There is mild thickening of the mitral valve leaflet(s). There is mild calcification of the mitral valve leaflet(s). Trivial mitral valve regurgitation. No evidence of mitral valve stenosis. MV peak gradient, 3.7 mmHg. The mean mitral valve gradient is 2.0 mmHg. Tricuspid Valve: The tricuspid valve is normal in structure. Tricuspid valve regurgitation is mild . No evidence of tricuspid stenosis. Aortic Valve: The aortic valve is tricuspid. There is moderate calcification of the aortic valve. There is moderate thickening of the aortic valve. Aortic valve regurgitation is mild. Aortic valve sclerosis is present, with no evidence of aortic valve stenosis. Pulmonic Valve: The pulmonic valve was normal in structure. Pulmonic valve regurgitation is not visualized. No evidence of pulmonic stenosis. Aorta: The aortic root is normal in size and structure. Venous: The inferior vena cava is normal in size with greater than 50% respiratory variability, suggesting right atrial pressure of 3 mmHg. IAS/Shunts: No atrial level shunt detected by color flow Doppler.  LEFT VENTRICLE PLAX 2D LVIDd:         3.70 cm     Diastology LVIDs:         2.70 cm     LV e' medial:  5.33 cm/s LV PW:         0.90 cm     LV E/e' medial:  10.6 LV IVS:        0.90 cm     LV e' lateral:   6.74 cm/s LVOT diam:     2.10 cm     LV E/e' lateral: 8.4 LV SV:         78 LV SV Index:   47 LVOT Area:     3.46 cm  LV Volumes (MOD) LV vol d, MOD A2C: 46.0 ml LV vol d, MOD  A4C: 53.2 ml LV vol s, MOD A2C: 18.5 ml LV vol s, MOD A4C: 17.7 ml LV SV MOD A2C:     27.5 ml LV SV MOD A4C:     53.2 ml LV SV MOD BP:      30.9 ml RIGHT VENTRICLE             IVC RV S prime:     12.00 cm/s  IVC diam: 1.20 cm TAPSE (M-mode): 1.3 cm LEFT ATRIUM             Index        RIGHT ATRIUM          Index LA diam:        3.20 cm 1.93 cm/m   RA Area:     9.64 cm LA Vol (A2C):   30.0 ml 18.09 ml/m  RA Volume:   17.50 ml 10.55 ml/m LA Vol (A4C):   24.8 ml 14.96 ml/m LA Biplane Vol: 29.2 ml 17.61 ml/m  AORTIC VALVE LVOT Vmax:   107.00 cm/s LVOT Vmean:  78.500 cm/s LVOT VTI:    0.225 m  AORTA Ao Root diam: 3.10 cm Ao Asc diam:  3.10 cm MITRAL VALVE MV Area (PHT): 2.57 cm    SHUNTS MV Area VTI:   3.15 cm    Systemic VTI:  0.22 m MV Peak grad:  3.7 mmHg    Systemic Diam: 2.10 cm MV Mean grad:  2.0 mmHg MV Vmax:       0.96 m/s MV Vmean:      59.6 cm/s MV Decel Time: 295 msec MV E velocity: 56.30 cm/s MV A velocity: 88.00 cm/s MV E/A ratio:  0.64 Charlton Haws MD Electronically signed by Charlton Haws MD Signature Date/Time: 10/16/2023/11:31:25 AM    Final    DG Chest Portable 1 View  Result Date: 10/14/2023 CLINICAL DATA:  Chest pain. EXAM: PORTABLE CHEST 1 VIEW COMPARISON:  10/08/2023 and older studies. FINDINGS: Cardiac silhouette is normal in size and configuration. Normal mediastinal and hilar contours. Clear lungs.  No pleural effusion or pneumothorax. Skeletal structures are grossly intact. IMPRESSION: No active disease. Electronically Signed   By: Amie Portland M.D.   On: 10/14/2023 15:23   CT Head Wo Contrast  Result Date: 10/08/2023 CLINICAL DATA:  HTN HA EXAM: CT HEAD WITHOUT CONTRAST TECHNIQUE: Contiguous axial images were obtained from the base of the skull through the vertex without intravenous contrast. RADIATION DOSE REDUCTION: This exam was performed according to the departmental dose-optimization program which includes automated exposure control, adjustment of the mA and/or kV according  to patient size and/or use of iterative reconstruction technique. COMPARISON:  None Available. FINDINGS: Brain: No evidence of large-territorial acute infarction. No parenchymal hemorrhage. No mass lesion. No extra-axial collection. No mass effect or midline shift. No hydrocephalus. Basilar cisterns are patent. Vascular: No hyperdense vessel. Skull: No acute fracture or focal lesion. Sinuses/Orbits:  Paranasal sinuses and mastoid air cells are clear. The orbits are unremarkable. Other: None. IMPRESSION: No acute intracranial abnormality. Electronically Signed   By: Tish Frederickson M.D.   On: 10/08/2023 18:25   DG Chest 2 View  Result Date: 10/08/2023 CLINICAL DATA:  Chest pain EXAM: CHEST - 2 VIEW COMPARISON:  Chest x-ray 01/07/2013. FINDINGS: No consolidation, pneumothorax or effusion. Normal cardiopericardial silhouette. No edema. Calcified aorta. Fixation hardware along the lower cervical spine at the edge of the imaging field. IMPRESSION: No acute cardiopulmonary disease. Electronically Signed   By: Karen Kays M.D.   On: 10/08/2023 14:30   Disposition   Pt is being discharged home today in good condition.  Follow-up Plans & Appointments     Discharge Instructions     Call MD for:  difficulty breathing, headache or visual disturbances   Complete by: As directed    Call MD for:  persistant dizziness or light-headedness   Complete by: As directed    Call MD for:  redness, tenderness, or signs of infection (pain, swelling, redness, odor or green/yellow discharge around incision site)   Complete by: As directed    Diet - low sodium heart healthy   Complete by: As directed    Discharge instructions   Complete by: As directed    Radial Site Care Refer to this sheet in the next few weeks. These instructions provide you with information on caring for yourself after your procedure. Your caregiver may also give you more specific instructions. Your treatment has been planned according to current  medical practices, but problems sometimes occur. Call your caregiver if you have any problems or questions after your procedure. HOME CARE INSTRUCTIONS You may shower the day after the procedure. Remove the bandage (dressing) and gently wash the site with plain soap and water. Gently pat the site dry.  Do not apply powder or lotion to the site.  Do not submerge the affected site in water for 3 to 5 days.  Inspect the site at least twice daily.  Do not flex or bend the affected arm for 24 hours.  No lifting over 5 pounds (2.3 kg) for 5 days after your procedure.  Do not drive home if you are discharged the same day of the procedure. Have someone else drive you.  You may drive 24 hours after the procedure unless otherwise instructed by your caregiver.  What to expect: Any bruising will usually fade within 1 to 2 weeks.  Blood that collects in the tissue (hematoma) may be painful to the touch. It should usually decrease in size and tenderness within 1 to 2 weeks.  SEEK IMMEDIATE MEDICAL CARE IF: You have unusual pain at the radial site.  You have redness, warmth, swelling, or pain at the radial site.  You have drainage (other than a small amount of blood on the dressing).  You have chills.  You have a fever or persistent symptoms for more than 72 hours.  You have a fever and your symptoms suddenly get worse.  Your arm becomes pale, cool, tingly, or numb.  You have heavy bleeding from the site. Hold pressure on the site.   Increase activity slowly   Complete by: As directed         Discharge Medications   Allergies as of 10/17/2023   No Known Allergies      Medication List     TAKE these medications    acetaminophen 325 MG tablet Commonly known as: TYLENOL Take 325 mg  by mouth daily as needed for moderate pain (pain score 4-6), headache or fever (for pain).   aspirin EC 81 MG tablet Take 1 tablet (81 mg total) by mouth daily. Swallow whole.   carvedilol 3.125 MG  tablet Commonly known as: COREG Take 1 tablet (3.125 mg total) by mouth 2 (two) times daily with a meal.   clopidogrel 75 MG tablet Commonly known as: PLAVIX TAKE 1 TABLET EVERY DAY What changed: when to take this   FISH OIL PO Take 1 capsule by mouth daily.   lisinopril 20 MG tablet Commonly known as: ZESTRIL Take 1 tablet (20 mg total) by mouth daily.   Multivitamin Women 50+ Tabs Take 1 tablet by mouth daily.   nitroGLYCERIN 0.4 MG SL tablet Commonly known as: NITROSTAT Place 1 tablet (0.4 mg total) under the tongue every 5 (five) minutes x 3 doses as needed for chest pain.   pantoprazole 20 MG tablet Commonly known as: PROTONIX Take 1 tablet (20 mg total) by mouth daily.   rosuvastatin 20 MG tablet Commonly known as: CRESTOR TAKE 1 TABLET EVERY DAY What changed: when to take this   VITAMIN D-3 PO Take 1 capsule by mouth daily.        Outstanding Labs/Studies   N/a   Duration of Discharge Encounter   Greater than 30 minutes including physician time.  Signed, Laverda Page, NP 10/17/2023, 11:22 AM

## 2023-10-17 NOTE — Telephone Encounter (Signed)
Expected discharge today  Nursing will outreach tomorrow for follow up

## 2023-10-17 NOTE — Telephone Encounter (Signed)
   Transition of Care Follow-up Phone Call Request    Patient Name: [name] Date of Birth: 02/04/1946 Date of Encounter: 10/17/2023  Primary Care Provider:  Thana Ates, MD Primary Cardiologist:  Elder Negus, MD  [name] has been scheduled for a transition of care follow up appointment with a [facility] provider:  Joni Reining 12/30  Please reach out to [name] within 48 hours of discharge to confirm appointment and review transition of care protocol questionnaire. Anticipated discharge date: 12/10  Laverda Page, NP  10/17/2023, 11:20 AM

## 2023-10-17 NOTE — Progress Notes (Signed)
   Patient Name: [name] Date of Encounter: 10/17/2023 [facility] Cardiologist: None   Interval Summary  .    Doing well today. No chest pain. Some bruising at cath site.  Vital Signs .    Vitals:   10/16/23 2300 10/17/23 0215 10/17/23 0651 10/17/23 0834  BP: 126/64 132/62 137/60 (!) 107/59  Pulse: 69 66 72 71  Resp: 15 17 16 20   Temp: 98 F (36.7 C)  98.2 F (36.8 C) 98.4 F (36.9 C)  TempSrc: Oral  Oral Oral  SpO2: 98% 100% 99% 95%  Weight:      Height:        Intake/Output Summary (Last 24 hours) at 10/17/2023 0938 Last data filed at 10/17/2023 0849 Gross per 24 hour  Intake 990.33 ml  Output 2450 ml  Net -1459.67 ml      10/15/2023    4:34 PM 10/14/2023    5:39 PM 10/10/2023    8:26 AM  Last 3 Weights  Weight (lbs) 143 lb 1.3 oz 143 lb 1.3 oz 143 lb  Weight (kg) 64.9 kg 64.9 kg 64.864 kg      Telemetry/ECG    Sinus rhythm - Personally Reviewed  Physical Exam .   GEN: No acute distress.   Neck: No JVD Cardiac: Normal rate and regular rhythm. No murmur or gallop. Excellent pulses.  Respiratory: Normal work of breathing.  MS: No edema  Assessment & Plan .     [name] is a 77 y.o. female with HTN, HL, CAD s/p LAD stent in August 2012 with 3.0 x 16 mm Promus stent who is being seen 10/14/2023 for the evaluation of chest pain.  Found to have NSTEMI with low level troponin positive.   1. NSTEMI: Tropinin 4 -> 23 -> 34 -> 22 - CAD s/p LAD PCI in Aug 2012 - cardiac cath shows widely patent stent.  - coronary flow analysis does show evidence of microvascular disease with high IMR and low CFR. Recommend beta blocker with Coreg, ACEi, statin. - continue aspirin  - continue Plavix   Nitrates may be counterproductive with microvascular disease  2. HTN - improved  - will resume lisinopril 20 mg daily.  - add Coreg 3.25 mg bid  3. HLD  - on high dose Crestor. Excellent control  4. Hypokalemia. Received IV lasix post cath due to elevated  EDP. I wonder if EDP not high due to fluids she received on admission. Echo normal. No diuretic needed now.   Patient is stable for DC today   For questions or updates, please contact [facility] Please consult [email or web address] for contact info under        Signed, Abdulkarim Eberlin [place], MD

## 2023-10-17 NOTE — Care Management Important Message (Signed)
Important Message  Patient Details  Name: [name] MRN: [id number] Date of Birth: 1946-02-18   Important Message Given:  Yes - Medicare IM     Dorena Bodo 10/17/2023, 2:18 PM

## 2023-10-18 ENCOUNTER — Ambulatory Visit (HOSPITAL_COMMUNITY): Payer: Medicare HMO

## 2023-10-18 NOTE — Telephone Encounter (Signed)
Patient identification verified by 2 forms. Marilynn Rail, RN    Patient contacted regarding discharge from [facility] on 10/17/23 .  Patient understands to follow up with provider NP Lyman Bishop on 12/30 at 1:55pm at [facility]. Patient understands discharge instructions? Yes  Patient understands medications and regiment? Yes Patient understands to bring all medications to this visit? Yes

## 2023-10-20 ENCOUNTER — Telehealth: Payer: Self-pay | Admitting: Cardiology

## 2023-10-20 NOTE — Telephone Encounter (Signed)
Pt made aware. She appreciates my follow up

## 2023-10-20 NOTE — Telephone Encounter (Signed)
   Pt c/o of Chest Pain: STAT if active CP, including tightness, pressure, jaw pain, radiating pain to shoulder/upper arm/back, CP unrelieved by Nitro. Symptoms reported of SOB, nausea, vomiting, sweating.  1. Are you having CP right now?   No  2. Are you experiencing any other symptoms (ex. SOB, nausea, vomiting, sweating)?   No  3. Is your CP continuous or coming and going?   Comes and goes   4. Have you taken Nitroglycerin?   No  5. How long have you been experiencing CP?    Ongoing for a couple of weeks   6. If NO CP at time of call then end call with telling Pt to call back or call 911 if Chest pain returns prior to return call from triage team.   Patient called regarding a slight pain she is having in her chest.  Patient noted she had a procedure earlier this week.

## 2023-10-20 NOTE — Telephone Encounter (Signed)
Pt reports chest discomfort that stays there for awhile and then goes away. Scale of 1/2 out of 10.  She further states that once in awhile it feels like a heaviness but she wonders if this is more anxiety related. Advised to keep a log of occurrences to bring with her to appt on 12/30.  We may have to start a low dose of Imdur. Aware that forwarding to provider for advisement, but that most likely will wait until her appt in couple weeks to determine tx plan. Patient verbalized understanding and agreeable to plan.  Will let pt know what provider says/responds.

## 2023-10-20 NOTE — Telephone Encounter (Signed)
Agree  Thanks MJP  

## 2023-10-26 MED ORDER — CARVEDILOL 3.125 MG PO TABS: 3.1250 mg | ORAL_TABLET | Freq: Two times a day (BID) | ORAL | 0 refills | Status: DC

## 2023-10-26 NOTE — Telephone Encounter (Signed)
Refill sent to [facility] for Carvedilol per patient request.

## 2023-11-05 NOTE — Progress Notes (Unsigned)
Cardiology Office Note:  .   Date:  11/06/2023  ID:  [name], DOB [birth year], MRN [id number] PCP: Thana Ates, MD  [facility] Providers Cardiologist:  Elder Negus, MD 1}   }   History of Present Illness: .   [name] is a 77 y.o. female  with PMH of HTN, Hl and CAD s/p previous LAD stent  in 2013.  Was seen in the hospital reporting chest pain which has been waxing and waning over the past several days with increasing frequency.  It was similar to the pain she felt prior to prior stenting.  The patient passed out in the waiting area and was found to be hypotensive on examination in ED.  She was also found to be hypokalemic with potassium of 3.1 and given IV hydration and potassium replacements.  She subsequently underwent cardiac catheterization on 10/16/2023 that showed widely patent stents and coronary FFR which showed evidence of microvascular disease with high MIR and low CFR.  She was started on beta-blocker with Coreg 3.125 mg twice daily, continued on ACE inhibitor statin aspirin and Plavix.  She is here for posthospitalization TOC follow-up.  She comes today with multiple questions posthospitalization to include her medications, symptoms, and prognosis.  Her son is a PA with cardiology at [facility].  She continues to have recurrent discomfort in her chest mostly across her shoulders and below the shoulder blades in front.  She denies dyspnea on exertion.  She has noticed that her blood pressures been higher over the last week as she has been having many guests at her home over the holidays.  She has been medically compliant.  She denies any associated symptoms of dyspnea on exertion, palpitations, or presyncope.  She questions whether or not she needs to continue aspirin, or carvedilol.  ROS: As above otherwise negative  Studies Reviewed: Marland Kitchen   EKG Interpretation Date/Time:  Monday November 06 2023 14:10:13 EST Ventricular Rate:  72 PR Interval:  160 QRS  Duration:  84 QT Interval:  384 QTC Calculation: 420 R Axis:   28  Text Interpretation: Normal sinus rhythm Normal ECG When compared with ECG of 16-Oct-2023 16:43, No significant change was found Confirmed by Joni Reining [phone] on 11/06/2023 4:35:59 PM    Echocardiogram 10/16/2023 1. Left ventricular ejection fraction, by estimation, is 60 to 65%. The  left ventricle has normal function. The left ventricle has no regional  wall motion abnormalities. Left ventricular diastolic parameters are  consistent with Grade I diastolic  dysfunction (impaired relaxation).   2. Right ventricular systolic function is normal. The right ventricular  size is normal.   3. The mitral valve is abnormal. Trivial mitral valve regurgitation. No  evidence of mitral stenosis.   4. The aortic valve is tricuspid. There is moderate calcification of the  aortic valve. There is moderate thickening of the aortic valve. Aortic  valve regurgitation is mild. Aortic valve sclerosis is present, with no  evidence of aortic valve stenosis.   5. The inferior vena cava is normal in size with greater than 50%  respiratory variability, suggesting right atrial pressure of 3 mmHg.    Cath: 10/16/2023     Ost LAD to Prox LAD lesion is 10% stenosed.   Prox LAD to Mid LAD lesion is 20% stenosed.   Ost LAD lesion is 20% stenosed.   1.  Patent proximal LAD stent with mild ostial and mid LAD disease. 2.  Evidence of coronary microvascular dysfunction with  CFR of 1.1 and IMR of 25. 3.  Highly elevated LVEDP of 26 mmHg; the patient was administered 20 mg of IV Lasix.   Recommendation: Medical therapy.   Diagnostic Dominance: Co-dominant    EKG Interpretation Date/Time:  Monday November 06 2023 14:10:13 EST Ventricular Rate:  72 PR Interval:  160 QRS Duration:  84 QT Interval:  384 QTC Calculation: 420 R Axis:   28  Text Interpretation: Normal sinus rhythm Normal ECG When compared with ECG of 16-Oct-2023 16:43,  No significant change was found Confirmed by Joni Reining [phone]) on 11/06/2023 4:35:59 PM    Physical Exam:   VS:  BP (!) 182/80 (BP Location: Right Arm, Patient Position: Sitting, Cuff Size: Normal)   Pulse 72   Ht 5\' 1"  (1.549 m)   Wt 145 lb (65.8 kg)   SpO2 97%   BMI 27.40 kg/m    Wt Readings from Last 3 Encounters:  11/06/23 145 lb (65.8 kg)  10/15/23 143 lb 1.3 oz (64.9 kg)  10/10/23 143 lb (64.9 kg)    GEN: Well nourished, well developed in no acute distress NECK: No JVD; No carotid bruits CARDIAC: RRR, no murmurs, rubs, gallops RESPIRATORY:  Clear to auscultation without rales, wheezing or rhonchi  ABDOMEN: Soft, non-tender, non-distended EXTREMITIES:  No edema; No deformity   ASSESSMENT AND PLAN: .    Recurrent chest pain: Multifactorial.  Recent ED evaluation revealed hypokalemia for which she was repleted.  She states that when her blood pressures up she does feel little pressure in her chest.  She has been started on carvedilol 3.125 mg twice daily and continued on lisinopril 20 mg daily.  Today she is without any discomfort but states she has been feeling it on and off since returning from the ED.  She may be having some microvascular ischemia and therefore I will start her on Ranexa 500 mg twice daily to see if this would be helpful.  Will follow-up with her in a month to see how she is doing.  2.  CAD: History of stent to the LAD in 2013.  She remains on secondary prevention with statin therapy, blood pressure control, DAPT, I would recommend purposeful exercise, and low-cholesterol diet.  3.  Hypertension: Blood pressures not well-controlled today in the office.  I did recheck it and it remained elevated.  She is to take her blood pressure 1 hour after taking medications in the morning while seated for 5 minutes.  She is to record this and bring it with her to her primary care provider's office who she is seeing next week.  If necessary we may need to add additional  medication which I would recommend being spironolactone 12.5 mg daily as this is potassium sparing.  4.  Hypercholesterolemia: Goal of LDL less than 70 with known CAD.  Most recent lipid profile on 10/15/2023 total cholesterol 123, LDL 41, HDL 73, LP(a) 69.0.  No changes in her statin therapy she is to continue rosuvastatin 20 mg daily as directed.  She is also on fish oil daily.  5.  Hypokalemia: Potassium of 3.1 when evaluated in the ED which certainly could be causing some of her chest discomfort.  She is on a PPI which will affect magnesium levels leading to hypokalemia.  She denies any excessive diuresis, nausea, vomiting, or diarrhea which would drop her potassium.  She will need follow-up BMET to evaluate her current status.  She is to see her primary care next week with labs.  If she  continues to have issues with hypokalemia further workup for hyperaldosteronism may need to be considered.         Signed, Bettey Mare. Liborio Nixon, ANP, AACC

## 2023-11-06 ENCOUNTER — Encounter: Payer: Self-pay | Admitting: Adult Health

## 2023-11-06 ENCOUNTER — Ambulatory Visit: Payer: Medicare HMO | Attending: Adult Health | Admitting: Adult Health

## 2023-11-06 ENCOUNTER — Other Ambulatory Visit: Payer: Self-pay | Admitting: Adult Health

## 2023-11-06 VITALS — BP 182/80 | HR 72 | Ht 61.0 in | Wt 145.0 lb

## 2023-11-06 DIAGNOSIS — R0789 Other chest pain: Secondary | ICD-10-CM

## 2023-11-06 DIAGNOSIS — E7800 Pure hypercholesterolemia, unspecified: Secondary | ICD-10-CM | POA: Diagnosis not present

## 2023-11-06 DIAGNOSIS — G8929 Other chronic pain: Secondary | ICD-10-CM | POA: Diagnosis not present

## 2023-11-06 DIAGNOSIS — I10 Essential (primary) hypertension: Secondary | ICD-10-CM | POA: Diagnosis not present

## 2023-11-06 DIAGNOSIS — E876 Hypokalemia: Secondary | ICD-10-CM | POA: Diagnosis not present

## 2023-11-06 DIAGNOSIS — I25708 Atherosclerosis of coronary artery bypass graft(s), unspecified, with other forms of angina pectoris: Secondary | ICD-10-CM | POA: Diagnosis not present

## 2023-11-06 DIAGNOSIS — I4891 Unspecified atrial fibrillation: Secondary | ICD-10-CM | POA: Diagnosis not present

## 2023-11-06 MED ORDER — CARVEDILOL 3.125 MG PO TABS: 3.1250 mg | ORAL_TABLET | Freq: Two times a day (BID) | ORAL | 3 refills | Status: DC

## 2023-11-06 MED ORDER — RANOLAZINE ER 500 MG PO TB12: 500.0000 mg | ORAL_TABLET | Freq: Two times a day (BID) | ORAL | 3 refills | Status: DC

## 2023-11-06 NOTE — Patient Instructions (Signed)
Medication Instructions:  START RANEXA 500 MG TWICE DAILY.    Lab Work: NONE    Testing/Procedures: NONE   Follow-Up: At [facility], you and your health needs are our priority.  As part of our continuing mission to provide you with exceptional heart care, we have created designated Provider Care Teams.  These Care Teams include your primary Cardiologist (physician) and Advanced Practice Providers (APPs -  Physician Assistants and Nurse Practitioners) who all work together to provide you with the care you need, when you need it.   Your next appointment:   3 month(s)   Provider:   Elder Negus, MD    Other Instructions PROVIDER RECOMMEND THAT YOU TAKE YOUR BLOOD PRESSURE AT THE SAME TIME EACH DAY 1 HOUR AFTER TAKING YOU MEDICATION. PLEASE LOG THESE BLOOD PRESSURE READINGS. TAKE THESE READINGS TO YOUR APPOINTMENT WITH YOUR PCP.

## 2023-11-14 ENCOUNTER — Other Ambulatory Visit (HOSPITAL_COMMUNITY): Payer: Medicare HMO

## 2023-11-14 DIAGNOSIS — E876 Hypokalemia: Secondary | ICD-10-CM | POA: Diagnosis not present

## 2023-11-17 ENCOUNTER — Other Ambulatory Visit (HOSPITAL_COMMUNITY): Payer: Self-pay

## 2023-11-27 ENCOUNTER — Other Ambulatory Visit: Payer: Self-pay | Admitting: Adult Health

## 2023-11-28 ENCOUNTER — Ambulatory Visit: Payer: Medicare HMO | Admitting: Podiatry

## 2023-12-05 ENCOUNTER — Ambulatory Visit: Payer: Medicare HMO | Admitting: Podiatry

## 2023-12-05 DIAGNOSIS — I2510 Atherosclerotic heart disease of native coronary artery without angina pectoris: Secondary | ICD-10-CM | POA: Diagnosis not present

## 2023-12-05 DIAGNOSIS — Z23 Encounter for immunization: Secondary | ICD-10-CM | POA: Diagnosis not present

## 2023-12-05 DIAGNOSIS — I10 Essential (primary) hypertension: Secondary | ICD-10-CM | POA: Diagnosis not present

## 2023-12-14 DIAGNOSIS — Z1231 Encounter for screening mammogram for malignant neoplasm of breast: Secondary | ICD-10-CM | POA: Diagnosis not present

## 2023-12-27 ENCOUNTER — Ambulatory Visit: Payer: Medicare HMO | Admitting: Podiatry

## 2023-12-27 ENCOUNTER — Encounter: Payer: Self-pay | Admitting: Podiatry

## 2023-12-27 DIAGNOSIS — B351 Tinea unguium: Secondary | ICD-10-CM

## 2023-12-27 DIAGNOSIS — M79674 Pain in right toe(s): Secondary | ICD-10-CM

## 2023-12-27 DIAGNOSIS — M79675 Pain in left toe(s): Secondary | ICD-10-CM | POA: Diagnosis not present

## 2023-12-27 NOTE — Progress Notes (Signed)
This patient presents to the office with thick painful big toenails  both feet.   She presents to the office for evaluation and treatment.  Patient takes plavix.  Vascular  Dorsalis pedis and posterior tibial pulses are palpable  B/L.  Capillary return  WNL.  Temperature gradient is  WNL.  Skin turgor  WNL  Sensorium  Senn Weinstein monofilament wire  WNL. Normal tactile sensation.  Nail Exam  Patient has nails with thickness noted hallux toenails.  Significant pincer toenails hallux  B/L.  No evidence of infection.    Orthopedic  Exam  Muscle tone and muscle strength  WNL.  No limitations of motion feet  B/L.  No crepitus or joint effusion noted.  Foot type is unremarkable and digits show no abnormalities.  Severe HAV  B/L.  Skin  No open lesions.  Normal skin texture and turgor.   Onychomycosis  Hallux nails  B/L.    Debrided hallux toenails.     Helane Gunther DPM

## 2024-01-31 NOTE — Progress Notes (Unsigned)
  Cardiology Office Note:  .   Date:  01/31/2024  ID:  [name], DOB 11-10-1945, MRN [id number] PCP: Thana Ates, MD  [facility] Providers Cardiologist:  Truett Mainland, MD PCP: Thana Ates, MD  No chief complaint on file.    [name] is a 78 y.o. female with hypertension, nonobstructive CAD, microvascular dysfunction  ***(Cath in 10/2023 for unstable angina)  Discussed the use of AI scribe software for clinical note transcription with the patient, who gave verbal consent to proceed.  History of Present Illness       There were no vitals filed for this visit.    ROS      Studies Reviewed: .        *** Independently interpreted 10/2023: Chol 125, TG 53, HDL 73, LDL 41 Lipoprotein (a) 69 Hb 11.7 Cr 0.89, Na/K 133/3.1   Coronary angiography 10/2023:  Ost LAD to Prox LAD lesion is 10% stenosed.   Prox LAD to Mid LAD lesion is 20% stenosed.   Ost LAD lesion is 20% stenosed.   1.  Patent proximal LAD stent with mild ostial and mid LAD disease. 2.  Evidence of coronary microvascular dysfunction with CFR of 1.1 and IMR of 25. 3.  Highly elevated LVEDP of 26 mmHg; the patient was administered 20 mg of IV Lasix.  Echocardiogram 10/2023: LVEF 60-65%. Grade 1 diastolic dysfunction. Aortic sclerosis     Risk Assessment/Calculations:   {Does this patient have ATRIAL FIBRILLATION?[phone]}    Physical Exam   VISIT DIAGNOSES: No diagnosis found.   [name] is a 78 y.o. female with *** Assessment and Plan Assessment & Plan       {Are you ordering a CV Procedure (e.g. stress test, cath, DCCV, TEE, etc)?   Press F2        :[id number]}    No orders of the defined types were placed in this encounter.    F/u in ***  Signed, Elder Negus, MD

## 2024-02-02 ENCOUNTER — Encounter: Payer: Self-pay | Admitting: Cardiology

## 2024-02-02 ENCOUNTER — Ambulatory Visit: Payer: Medicare HMO | Attending: Internal Medicine | Admitting: Cardiology

## 2024-02-02 VITALS — BP 122/68 | HR 69 | Resp 16 | Ht 61.0 in | Wt 136.0 lb

## 2024-02-02 DIAGNOSIS — I10 Essential (primary) hypertension: Secondary | ICD-10-CM | POA: Diagnosis not present

## 2024-02-02 DIAGNOSIS — I2510 Atherosclerotic heart disease of native coronary artery without angina pectoris: Secondary | ICD-10-CM | POA: Diagnosis not present

## 2024-02-02 DIAGNOSIS — I2585 Chronic coronary microvascular dysfunction: Secondary | ICD-10-CM | POA: Diagnosis not present

## 2024-02-02 DIAGNOSIS — E782 Mixed hyperlipidemia: Secondary | ICD-10-CM

## 2024-02-02 MED ORDER — LISINOPRIL 20 MG PO TABS: 20.0000 mg | ORAL_TABLET | Freq: Every day | ORAL | 3 refills | Status: AC

## 2024-02-02 MED ORDER — ROSUVASTATIN CALCIUM 20 MG PO TABS: 20.0000 mg | ORAL_TABLET | Freq: Every day | ORAL | 3 refills | Status: AC

## 2024-02-02 MED ORDER — CLOPIDOGREL BISULFATE 75 MG PO TABS: 75.0000 mg | ORAL_TABLET | Freq: Every day | ORAL | 3 refills | Status: AC

## 2024-02-02 MED ORDER — RANOLAZINE ER 500 MG PO TB12: 500.0000 mg | ORAL_TABLET | Freq: Two times a day (BID) | ORAL | 3 refills | Status: AC

## 2024-02-02 MED ORDER — NITROGLYCERIN 0.4 MG SL SUBL: 0.4000 mg | SUBLINGUAL_TABLET | SUBLINGUAL | 3 refills | Status: AC | PRN

## 2024-02-02 MED ORDER — CARVEDILOL 3.125 MG PO TABS: 3.1250 mg | ORAL_TABLET | Freq: Two times a day (BID) | ORAL | 3 refills | Status: AC

## 2024-02-02 NOTE — Patient Instructions (Signed)
 Medication Instructions:   STOP TAKING ASPIRIN NOW  *If you need a refill on your cardiac medications before your next appointment, please call your pharmacy*    Follow-Up: At [facility], you and your health needs are our priority.  As part of our continuing mission to provide you with exceptional heart care, our providers are all part of one team.  This team includes your primary Cardiologist (physician) and Advanced Practice Providers or APPs (Physician Assistants and Nurse Practitioners) who all work together to provide you with the care you need, when you need it.  Your next appointment:   1 year(s)  Provider:   Elder Negus, MD     We recommend signing up for the patient portal called "MyChart".  Sign up information is provided on this After Visit Summary.  MyChart is used to connect with patients for Virtual Visits (Telemedicine).  Patients are able to view lab/test results, encounter notes, upcoming appointments, etc.  Non-urgent messages can be sent to your provider as well.   To learn more about what you can do with MyChart, go to [email or web address].         1st Floor: - Lobby - Registration  - Pharmacy  - Lab - Cafe  2nd Floor: - PV Lab - Diagnostic Testing (echo, CT, nuclear med)  3rd Floor: - Vacant  4th Floor: - TCTS (cardiothoracic surgery) - AFib Clinic - Structural Heart Clinic - Vascular Surgery  - Vascular Ultrasound  5th Floor: - HeartCare Cardiology (general and EP) - Clinical Pharmacy for coumadin, hypertension, lipid, weight-loss medications, and med management appointments    Valet parking services will be available as well.  '

## 2024-04-17 ENCOUNTER — Ambulatory Visit: Payer: Medicare HMO | Admitting: Podiatry

## 2024-04-25 DIAGNOSIS — Z1331 Encounter for screening for depression: Secondary | ICD-10-CM | POA: Diagnosis not present

## 2024-04-25 DIAGNOSIS — Z0000 Encounter for general adult medical examination without abnormal findings: Secondary | ICD-10-CM | POA: Diagnosis not present

## 2024-04-25 DIAGNOSIS — Z79899 Other long term (current) drug therapy: Secondary | ICD-10-CM | POA: Diagnosis not present

## 2024-04-25 DIAGNOSIS — E559 Vitamin D deficiency, unspecified: Secondary | ICD-10-CM | POA: Diagnosis not present

## 2024-04-25 DIAGNOSIS — I10 Essential (primary) hypertension: Secondary | ICD-10-CM | POA: Diagnosis not present

## 2024-05-06 ENCOUNTER — Ambulatory Visit: Admitting: Podiatry

## 2024-05-06 ENCOUNTER — Encounter: Payer: Self-pay | Admitting: Podiatry

## 2024-05-06 DIAGNOSIS — M79674 Pain in right toe(s): Secondary | ICD-10-CM

## 2024-05-06 DIAGNOSIS — M79675 Pain in left toe(s): Secondary | ICD-10-CM

## 2024-05-06 DIAGNOSIS — B351 Tinea unguium: Secondary | ICD-10-CM

## 2024-05-06 DIAGNOSIS — E785 Hyperlipidemia, unspecified: Secondary | ICD-10-CM | POA: Diagnosis not present

## 2024-05-06 DIAGNOSIS — I10 Essential (primary) hypertension: Secondary | ICD-10-CM | POA: Diagnosis not present

## 2024-05-06 DIAGNOSIS — I2510 Atherosclerotic heart disease of native coronary artery without angina pectoris: Secondary | ICD-10-CM | POA: Diagnosis not present

## 2024-05-06 NOTE — Progress Notes (Signed)
 This patient presents to the office with thick painful big toenails  both feet.   She presents to the office for evaluation and treatment.  Patient takes plavix .  Vascular  Dorsalis pedis and posterior tibial pulses are palpable  B/L.  Capillary return  WNL.  Temperature gradient is  WNL.  Skin turgor  WNL  Sensorium  Senn Weinstein monofilament wire  WNL. Normal tactile sensation.  Nail Exam  Patient has nails with thickness noted hallux toenails.  Significant pincer toenails hallux  B/L.  No evidence of infection.    Orthopedic  Exam  Muscle tone and muscle strength  WNL.  No limitations of motion feet  B/L.  No crepitus or joint effusion noted.  Foot type is unremarkable and digits show no abnormalities.  Severe HAV  B/L.  Skin  No open lesions.  Normal skin texture and turgor.   Onychomycosis  Hallux nails  B/L.    Debrided hallux toenails.  Patient requested longer time frame between visits since she is not dealing with ingrown toenails.  RTC  months    Cordella Bold DPM

## 2024-05-09 DIAGNOSIS — D225 Melanocytic nevi of trunk: Secondary | ICD-10-CM | POA: Diagnosis not present

## 2024-05-09 DIAGNOSIS — L814 Other melanin hyperpigmentation: Secondary | ICD-10-CM | POA: Diagnosis not present

## 2024-05-09 DIAGNOSIS — L821 Other seborrheic keratosis: Secondary | ICD-10-CM | POA: Diagnosis not present

## 2024-06-06 DIAGNOSIS — I2510 Atherosclerotic heart disease of native coronary artery without angina pectoris: Secondary | ICD-10-CM | POA: Diagnosis not present

## 2024-06-06 DIAGNOSIS — I10 Essential (primary) hypertension: Secondary | ICD-10-CM | POA: Diagnosis not present

## 2024-06-06 DIAGNOSIS — E785 Hyperlipidemia, unspecified: Secondary | ICD-10-CM | POA: Diagnosis not present

## 2024-07-07 DIAGNOSIS — I10 Essential (primary) hypertension: Secondary | ICD-10-CM | POA: Diagnosis not present

## 2024-07-07 DIAGNOSIS — I2510 Atherosclerotic heart disease of native coronary artery without angina pectoris: Secondary | ICD-10-CM | POA: Diagnosis not present

## 2024-07-07 DIAGNOSIS — E785 Hyperlipidemia, unspecified: Secondary | ICD-10-CM | POA: Diagnosis not present

## 2024-08-06 DIAGNOSIS — I2510 Atherosclerotic heart disease of native coronary artery without angina pectoris: Secondary | ICD-10-CM | POA: Diagnosis not present

## 2024-08-06 DIAGNOSIS — E785 Hyperlipidemia, unspecified: Secondary | ICD-10-CM | POA: Diagnosis not present

## 2024-08-06 DIAGNOSIS — I10 Essential (primary) hypertension: Secondary | ICD-10-CM | POA: Diagnosis not present

## 2024-08-19 DIAGNOSIS — H5203 Hypermetropia, bilateral: Secondary | ICD-10-CM | POA: Diagnosis not present

## 2024-08-19 DIAGNOSIS — H2513 Age-related nuclear cataract, bilateral: Secondary | ICD-10-CM | POA: Diagnosis not present

## 2024-09-06 DIAGNOSIS — E785 Hyperlipidemia, unspecified: Secondary | ICD-10-CM | POA: Diagnosis not present

## 2024-09-06 DIAGNOSIS — I2510 Atherosclerotic heart disease of native coronary artery without angina pectoris: Secondary | ICD-10-CM | POA: Diagnosis not present

## 2024-09-06 DIAGNOSIS — I10 Essential (primary) hypertension: Secondary | ICD-10-CM | POA: Diagnosis not present

## 2024-10-06 DIAGNOSIS — E785 Hyperlipidemia, unspecified: Secondary | ICD-10-CM | POA: Diagnosis not present

## 2024-10-06 DIAGNOSIS — I2510 Atherosclerotic heart disease of native coronary artery without angina pectoris: Secondary | ICD-10-CM | POA: Diagnosis not present

## 2024-10-06 DIAGNOSIS — I10 Essential (primary) hypertension: Secondary | ICD-10-CM | POA: Diagnosis not present

## 2024-10-07 ENCOUNTER — Ambulatory Visit: Admitting: Podiatry

## 2024-10-07 ENCOUNTER — Encounter: Payer: Self-pay | Admitting: Podiatry

## 2024-10-07 DIAGNOSIS — B351 Tinea unguium: Secondary | ICD-10-CM | POA: Diagnosis not present

## 2024-10-07 DIAGNOSIS — M79675 Pain in left toe(s): Secondary | ICD-10-CM

## 2024-10-07 DIAGNOSIS — M79674 Pain in right toe(s): Secondary | ICD-10-CM | POA: Diagnosis not present

## 2024-10-07 NOTE — Progress Notes (Addendum)
 This patient presents to the office with thick painful big toenails  both feet.   She presents to the office for evaluation and treatment.  Patient takes plavix .  Vascular  Dorsalis pedis and posterior tibial pulses are palpable  B/L.  Capillary return  WNL.  Temperature gradient is  WNL.  Skin turgor  WNL  Sensorium  Senn Weinstein monofilament wire  WNL. Normal tactile sensation.  Nail Exam  Patient has nails with thickness noted hallux toenails.  Significant pincer toenails hallux  B/L.  No evidence of infection.    Orthopedic  Exam  Muscle tone and muscle strength  WNL.  No limitations of motion feet  B/L.  No crepitus or joint effusion noted.  Foot type is unremarkable and digits show no abnormalities.  Severe HAV  B/L.  Skin  No open lesions.  Normal skin texture and turgor.   Onychomycosis  Hallux nails  B/L.    Debrided hallux toenails.  Patient requested longer time frame between visits since she is not dealing with ingrown toenails.  RTC  3  months    Cordella Bold DPM

## 2024-10-23 DIAGNOSIS — I2510 Atherosclerotic heart disease of native coronary artery without angina pectoris: Secondary | ICD-10-CM | POA: Diagnosis not present

## 2025-02-24 ENCOUNTER — Ambulatory Visit: Admitting: Cardiology

## 2025-03-07 ENCOUNTER — Ambulatory Visit: Admitting: Podiatry
